# Patient Record
Sex: Male | Born: 1960 | Race: White | Hispanic: No | Marital: Single | Smoking: Former smoker
Health system: Southern US, Community
[De-identification: ages and names within clinical notes are randomized; demographics above are authoritative.]

## PROBLEM LIST (undated history)

## (undated) DIAGNOSIS — R002 Palpitations: Secondary | ICD-10-CM

## (undated) DIAGNOSIS — I10 Essential (primary) hypertension: Secondary | ICD-10-CM

## (undated) DIAGNOSIS — I2510 Atherosclerotic heart disease of native coronary artery without angina pectoris: Secondary | ICD-10-CM

## (undated) DIAGNOSIS — E7800 Pure hypercholesterolemia, unspecified: Secondary | ICD-10-CM

## (undated) DIAGNOSIS — I5189 Other ill-defined heart diseases: Secondary | ICD-10-CM

## (undated) HISTORY — PX: APPENDECTOMY: SHX54

---

## 2005-02-13 ENCOUNTER — Other Ambulatory Visit: Payer: Self-pay

## 2005-02-13 ENCOUNTER — Emergency Department: Payer: Self-pay | Admitting: Emergency Medicine

## 2006-05-21 ENCOUNTER — Ambulatory Visit: Payer: Self-pay | Admitting: Emergency Medicine

## 2012-09-09 DIAGNOSIS — R609 Edema, unspecified: Secondary | ICD-10-CM | POA: Insufficient documentation

## 2012-10-30 ENCOUNTER — Ambulatory Visit: Payer: Self-pay | Admitting: Family Medicine

## 2014-02-01 ENCOUNTER — Ambulatory Visit: Payer: Self-pay | Admitting: Family Medicine

## 2014-02-03 ENCOUNTER — Inpatient Hospital Stay: Payer: Self-pay | Admitting: Internal Medicine

## 2014-02-03 ENCOUNTER — Ambulatory Visit: Payer: Self-pay | Admitting: Family Medicine

## 2014-02-03 LAB — CBC WITH DIFFERENTIAL/PLATELET
Basophil #: 0 10*3/uL (ref 0.0–0.1)
Basophil %: 0.5 %
Eosinophil #: 0 10*3/uL (ref 0.0–0.7)
Eosinophil %: 0.6 %
HCT: 42.2 % (ref 40.0–52.0)
HGB: 13.8 g/dL (ref 13.0–18.0)
Lymphocyte #: 1.3 10*3/uL (ref 1.0–3.6)
Lymphocyte %: 19.6 %
MCH: 26.7 pg (ref 26.0–34.0)
MCHC: 32.7 g/dL (ref 32.0–36.0)
MCV: 82 fL (ref 80–100)
MONOS PCT: 19.5 %
Monocyte #: 1.3 x10 3/mm — ABNORMAL HIGH (ref 0.2–1.0)
Neutrophil #: 3.8 10*3/uL (ref 1.4–6.5)
Neutrophil %: 59.8 %
Platelet: 148 10*3/uL — ABNORMAL LOW (ref 150–440)
RBC: 5.17 10*6/uL (ref 4.40–5.90)
RDW: 15.2 % — ABNORMAL HIGH (ref 11.5–14.5)
WBC: 6.4 10*3/uL (ref 3.8–10.6)

## 2014-02-03 LAB — COMPREHENSIVE METABOLIC PANEL
ALK PHOS: 43 U/L — AB (ref 46–116)
ALT: 39 U/L (ref 14–63)
Albumin: 3.1 g/dL — ABNORMAL LOW (ref 3.4–5.0)
Anion Gap: 4 — ABNORMAL LOW (ref 7–16)
BILIRUBIN TOTAL: 0.4 mg/dL (ref 0.2–1.0)
BUN: 13 mg/dL (ref 7–18)
CREATININE: 1.18 mg/dL (ref 0.60–1.30)
Calcium, Total: 8.7 mg/dL (ref 8.5–10.1)
Chloride: 106 mmol/L (ref 98–107)
Co2: 29 mmol/L (ref 21–32)
EGFR (African American): >60
EGFR (Non-African Amer.): >60
Glucose: 79 mg/dL (ref 65–99)
OSMOLALITY: 277 (ref 275–301)
Potassium: 3.8 mmol/L (ref 3.5–5.1)
SGOT(AST): 32 U/L (ref 15–37)
SODIUM: 139 mmol/L (ref 136–145)
TOTAL PROTEIN: 6.9 g/dL (ref 6.4–8.2)

## 2014-02-04 LAB — CBC WITH DIFFERENTIAL/PLATELET
Basophil #: 0 10*3/uL (ref 0.0–0.1)
Basophil %: 0.3 %
EOS ABS: 0 10*3/uL (ref 0.0–0.7)
Eosinophil %: 0.4 %
HCT: 40.8 % (ref 40.0–52.0)
HGB: 13.4 g/dL (ref 13.0–18.0)
Lymphocyte #: 1.4 10*3/uL (ref 1.0–3.6)
Lymphocyte %: 18.1 %
MCH: 26.6 pg (ref 26.0–34.0)
MCHC: 32.8 g/dL (ref 32.0–36.0)
MCV: 81 fL (ref 80–100)
MONO ABS: 1.5 x10 3/mm — AB (ref 0.2–1.0)
Monocyte %: 19 %
Neutrophil #: 4.8 10*3/uL (ref 1.4–6.5)
Neutrophil %: 62.2 %
PLATELETS: 152 10*3/uL (ref 150–440)
RBC: 5.02 10*6/uL (ref 4.40–5.90)
RDW: 15.3 % — AB (ref 11.5–14.5)
WBC: 7.8 10*3/uL (ref 3.8–10.6)

## 2014-02-04 LAB — BASIC METABOLIC PANEL
Anion Gap: 7 (ref 7–16)
BUN: 12 mg/dL (ref 7–18)
CHLORIDE: 105 mmol/L (ref 98–107)
Calcium, Total: 8.5 mg/dL (ref 8.5–10.1)
Co2: 25 mmol/L (ref 21–32)
Creatinine: 1.15 mg/dL (ref 0.60–1.30)
EGFR (African American): >60
EGFR (Non-African Amer.): >60
Glucose: 106 mg/dL — ABNORMAL HIGH (ref 65–99)
OSMOLALITY: 274 (ref 275–301)
POTASSIUM: 4 mmol/L (ref 3.5–5.1)
Sodium: 137 mmol/L (ref 136–145)

## 2014-02-08 LAB — CULTURE, BLOOD (SINGLE)

## 2014-05-08 NOTE — Consult Note (Signed)
PATIENT NAME:  Zachary Ferguson, Zachary Ferguson MR#:  [id number] DATE OF BIRTH:  06/01/1960  DATE OF CONSULTATION:  02/04/2014  REFERRING PHYSICIAN:   CONSULTING PHYSICIAN:  Stann Mainland. Sampson Goon, MD  REQUESTING PHYSICIAN:  Altamese Dilling, MD.    REASON FOR CONSULTATION:  Facial swelling and redness.   HISTORY OF PRESENT ILLNESS: This  is a 54 year old gentleman with history of hypertension, hypercholesterolemia, is quite active, and works as an Technical brewer. He reports that 4 days ago he started to have some body aches and generalized weakness. Then on Tuesday the 26th he developed vesicles on his forehead.  He was seen in urgent care and diagnosed with shingles, given Valtrex 1 gram q. 8. He was also seen by ophthalmology that same day and the next day as the redness and swelling increased in his right eye. On his followup visit he was given erythromycin cream. However the pain became unbearable on the right side and his eye was so swollen he could not open it. He was sent over for direct admission. He has been admitted to an isolation room. He was started on vancomycin and Zosyn as well as IV acyclovir. Clinically he continues to have severe pain relieved only by Dilaudid. In the past he has not been on any pain medications. He has been started on gabapentin as well. He reports having a fever to 100.8 yesterday and having some chills. He has had a CT of his head which is negative for orbital cellulitis.   PAST MEDICAL HISTORY: Hypertension, hyperlipidemia.   PAST SURGICAL HISTORY: None.   SOCIAL HISTORY: Does not smoke. Drinks socially. He works as an Therapist, occupational.   FAMILY HISTORY: Positive for aneurysm in mother, diabetes in his father, and coronary artery disease.   ALLERGIES: No known drug allergies.   MEDICATIONS SINCE ADMISSION: Acyclovir IV, vancomycin, and Zosyn. He is also on Dilaudid, Tylenol, gabapentin, and just started on prednisone.   REVIEW OF SYSTEMS:  11 systems reviewed  and negative except as per HPI.    PHYSICAL EXAMINATION:  VITAL SIGNS: Temperature 100.8 overnight, now 98.4, pulse 62, blood pressure 113/78, respirations 11 saturation 96% on room air.  GENERAL: He is pleasant, interactive, oriented.  FACE:  On his facial exam he has an impressive erythema and vesicles over his right forehead. He has marked edema of the upper and lower eyelid.  OROPHARYNX: Clear.  NECK: Supple.  EARS:  Tympanic membranes are normal.  HEART: Regular.  LUNGS: Clear.  ABDOMEN: Soft, nontender, nondistended. No hepatosplenomegaly.  EXTREMITIES: No clubbing, cyanosis or edema.  NEUROLOGIC:  He is alert and oriented x 3, grossly nonfocal neurologic exam.   DATA: White blood count 6.4, hemoglobin 13.8, platelets 148,000, now 152,000. Blood cultures 2 of 2 negative. LFTs normal. Renal function normal.   IMAGING: CT of his head, maxillofacial area shows right periorbital and preseptal soft tissue swelling consistent with cellulitis, no evidence of abscess or postseptal inflammation.   IMPRESSION: A 54 year old gentleman with a herpes zoster outbreak on his forehead involving his eyelids. He has marked edema and impressive pain. He may have a bacterial superinfection, however his white count is normal. CT does not show any orbital cellulitis. He has had a fever to 100.8 and some chills.   RECOMMENDATIONS:  1.  Overnight I would continue vancomycin, Zosyn, and acyclovir. However if he continues to improve I would switch him tomorrow to Augmentin and doxycycline for a 10 day course total. He should also finish the Valtrex 1000 mg  [facility].i.d. [facility] for a total of 10 days. I would give a prednisone taper which he has just started today. Suggest a taper over 7-10 days.  2.  I would recommend aggressive pain control with the gabapentin as well as the Dilaudid for now. He will likely need oxycodone for a short course at discharge.  3.  If he worsens clinically I would  suggest ophthalmology evaluation. 4.  Thank you for the consult.  I will be glad to follow with you    ____________________________ Stann Mainlandavid P. Sampson GoonFitzgerald, MD dpf:bu D: 02/04/2014 14:23:02 ET T: 02/04/2014 14:45:43 ET JOB#: [id number]  cc: Stann Mainlandavid P. Sampson GoonFitzgerald, MD, <Dictator> DAVID Sampson GoonFITZGERALD MD ELECTRONICALLY SIGNED 02/16/2014 20:48

## 2014-05-08 NOTE — Discharge Summary (Signed)
PATIENT NAME:  Zachary Ferguson, Zachary Ferguson MR#:  [id number] DATE OF BIRTH:  1960-08-21  DATE OF ADMISSION:  02/03/2014 DATE OF DISCHARGE:  02/05/2014  DISCHARGE DIAGNOSES:  1.  Shingles and possible cellulitis on the face.  2.  Pain.  3.  Hypertension.  4.  Hyperlipidemia.   DISCHARGE MEDICATIONS:  1.  Aspirin 81 mg once a day.  2.  Crestor 40 mg once a day.  3.  Losartan 25 mg once a day.  4.  Loratadine 10 mg once a day.  5.  Metoprolol 50 mg extended release once a day.  6.  Fenofibrate 160 mg once a day.  7.  Acetaminophen and hydrocodone 325/5 mg 4 times a day for 7 days.  8.  Valacyclovir 1 gram oral every 8 hours for 2 days.  9.  Prednisone 10 mg oral tablets, start at 60 mg and taper x 5 mg until complete.  10. MS Contin 15 mg,  tablet every 8 hours for 8 days.  11. Gabapentin 300 mg oral capsule 3 times a day.  12. Amoxicillin and clavulanate 875 mg every 12 hours for 8 days.  13. Doxycycline 100 mg oral 2 times a day for 8 days.   DIET:  Advance to a low sodium and low cholesterol diet on discharge and follow up in 1 to 2 weeks in ophthalmology clinic and primary care physician.   HISTORY OF PRESENTING ILLNESS: A 54 year old male with history of hypertension and hypercholesterolemia for 3 to 4 days.  He had viral-like symptoms, generalized weakness and fatigue and 2 days ago he noticed some vesicles on his right side of his forehead, so went to [facility].  The vesicles were painful.  The doctor told him it was shingles, gave him Valacyclovir prescription and advised to follow in ophthalmology clinic. He went to the ophthalmology clinic, eye sight was clear.  Next day, the swelling was severe, so he went to ophthalmology again, but they checked and the vision was fine, gave him some erythromycin ointment and sent him home.  Swelling and pain continued to get worse, so he went to [facility] again the next day, and because of severe worsening of swelling, he was sent  to the hospital for direct admission.   HOSPITAL COURSE AND STAY:  On admission, because of his severe swelling and pain, he was suspected to have superimposed cellulitis on his shingles and started on IV vancomycin and Zosyn.  Infectious disease consult was called in.  They saw the patient and as the patient's pain and symptoms were getting slowly better in the next 2 days, decided to treat him for a total of 10 days for suspected cellulitis in his shingles, continue overall Valacyclovir plus Augmentin and doxycycline and also started on steroid in the hospital, which we are tapering on discharge.     Other medical issues in the hospital: 1.  Pain because of shingles.  We gave him morphine and oxycodone and given a prescription for the next 1 week.    2.  Hypertension. Continued metoprolol and he remained stable.  3.  Hyperlipidemia. Continued dose of what he was taking and remained stable.   CONSULT IN HOSPITAL:  Dr. Sampson Goon for infectious disease.  LABORATORY DATA IN THE HOSPITAL:  Blood culture showed no growth on admission, WBC count 6.4, hemoglobin 13.8, platelet count 148,000, MCV 82.  BUN 13, creatinine 1.18, sodium 139, potassium 3.8, chloride 106.  CT scan of the maxillofacial area was  done to rule out any spreading of cellulitis, showed right periorbital and 3 septal soft tissue swelling consistent with cellulitis. No evidence of abscess or post-septal inflammation. No evidence of active sinus disease or osteomyelitis.  Creatinine was 1.15 on discharge.   TOTAL TIME SPENT ON DISCHARGE:  40 minutes.     ____________________________ Hope PigeonVaibhavkumar G. Elisabeth PigeonVachhani, MD vgv:DT D: 02/08/2014 07:43:08 ET T: 02/08/2014 09:22:33 ET JOB#: [id number]  cc: Hope PigeonVaibhavkumar G. Elisabeth PigeonVachhani, MD, <Dictator> Altamese DillingVAIBHAVKUMAR Audianna Landgren MD ELECTRONICALLY SIGNED 02/22/2014 10:43

## 2014-05-08 NOTE — H&P (Signed)
PATIENT NAME:  Zachary Ferguson, Zachary Ferguson MR#:  [id number] DATE OF BIRTH:  11-19-1960  DATE OF ADMISSION:  02/03/2014  PRIMARY CARE PHYSICIAN: At [place].    REFERRING PHYSICIAN:  [place] [facility] Dr. Payton Mccallum.   CHIEF COMPLAINT: Swelling on the face.   HISTORY OF PRESENT ILLNESS: This is a 54 year old male who has history of hypertension and hypercholesterolemia. For the last 3-4 days ago he had viral-like symptoms, generalized weakness and fatigue. Next day, which was Tuesday on the 26th he noticed some small vesicles on his right side of forehead, and so he went to [facility] urgent care center as it was painful also and the doctor over there told it is shingles, gave him oral valacyclovir 1 gram every 8 hours and sent him to ophthalmology clinic for checking any infection to eye. Ophthalmologist checked that and told him everything is fine and nothing to do and continue his treatment for his shingles. Next day which was on 27th and Wednesday his pain continued to get worse and there was a little bit more swelling, so he went again to ophthalmology clinic and they checked his vision which was satisfactory, 20/20 and so they gave him some erythromycin local cream to apply on his eye and sent him back home, advised to continue valacyclovir.  Now today, on January 28 his got worse and pain was unbearable, it was so much bad that his eyelid got swollen and he could not open his right eye at all, so he decided to go back to urgent care center and the doctor saw him much worse and so called in for direct admission to hospital as he might need to get admitted as per him. On arrival over here I examined the patient and asked him about any fever. He said that yes, he had some fever and was feeling generalized weakness for the last 3-4 days and aches and pain. He denies any similar history in the past. 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    REVIEW OF  SYSTEMS:   CONSTITUTIONAL: Positive for fatigue and fever. No weight loss or weight gain.  Has pain in the right side of the face and eye.  HEENT:  Eyes, no blurring or double vision, but cannot open the right eye now so he does not know about his vision. No discharge from eye or ear.  No hearing loss, but has some tinnitus and ringing in the ears when he is moving.   RESPIRATORY: No cough, wheezing, hemoptysis, or shortness of breath. CARDIOVASCULAR: No chest pain, orthopnea, edema, arrhythmia, palpitation.  GASTROINTESTINAL: No nausea, vomiting, diarrhea, abdominal pain.  GENITOURINARY: No dysuria, hematuria, or increased frequency.  ENDOCRINE: No heat or cold intolerance. No excessive sweating.  SKIN: No acne, rashes, or lesions.  MUSCULOSKELETAL: No pain or swelling in the joints.  NEUROLOGICAL: No numbness, weakness, tremor, or vertigo.  PSYCHIATRIC: No anxiety, insomnia, bipolar disorder.   PAST MEDICAL HISTORY:  1. Hypertension.  2. Hyperlipidemia.   PAST SURGICAL HISTORY: None.   SOCIAL HISTORY: He does not smoke. Drinks alcohol socially very rarely, like 2-3 times a year. He denies any drugs and he works as an Therapist, occupational.   FAMILY HISTORY: Positive for aneurysm in the brain in mother and diabetes and father had some coronary artery disease.   HOME MEDICATIONS:   1. Valacyclovir 1 gram oral tablet every 8 hours.  2. Metoprolol 50 mg oral once a  day.  3. Crestor 40 mg once a day.  4. Aspirin 81 mg once a day.  5. Acetaminophen and hydrocodone 325 + 5 mg 1-2 tablets 3 times a day for severe pain.   PHYSICAL EXAMINATION:  VITAL SIGNS: Temperature is 98.4, pulse is 82, respiration is 16, blood pressure 126/82, and pulse oximetry is 96 on room air.  GENERAL: The patient is fully alert and oriented to time, place, and person. Does not appear in any acute distress, but discomfort because of pain.  HEENT: Head and neck atraumatic. There is swelling and redness on his  right side of the face on his forehead, extending into his scalp and up to the upper part of the cheek and extending on the side on the same with some visible vesicles which are filled with some turbid-looking fluid and the skin is swollen and red on this area. Eyelid is swollen up to the extent that he is not able to open it voluntarily and there excruciating pain on trying to open his eyelid. On the left side his pupil is reactive to light and vision is normal.  NECK: Supple. No JVD. Thyroid nontender.  RESPIRATORY: Bilateral equal and clear air entry. No wheezing or crepitation.  CARDIOVASCULAR: S1, S2 present, regular. No murmur.  ABDOMEN: Soft, nontender. Bowel sounds present. No organomegaly.  SKIN: No acne, rashes, or lesions.  MUSCULOSKELETAL: No pain or swelling in the joints.  NEUROLOGICAL: No numbness, weakness. Power 5 out of 5. No tremor or rigidity. Follows commands.  PSYCHIATRIC: Does not appear in acute psychiatric illness at this time.   IMPORTANT LABORATORY RESULTS: Since the patient is direct admission there are no laboratories drawn yet, will check once the report comes back.   ASSESSMENT AND PLAN:  A 54 year old male with hypertension and hyperlipidemia, sent from [facility] urgent care center with worsening of his shingles, which he is already getting treatment for the last 2 days, valacyclovir, but the swelling is getting worse and severe eye pain, so sent in for direct admission.   1.  Cellulitis on right face. Appear that there is some superimposed bacterial cellulitis also on the right face and I will start on vancomycin and Zosyn IV for that and pharmacy to dose the medications. We will check his creatinine level now. We will also get blood culture.  2.  Shingles on the right face. The patient has excruciating pain with that, he was already taking oral valacyclovir but he got worse, so for now I will start him on IV acyclovir, pharmacy to dose the medication and will give  morphine for pain control and I spoke to Dr. Sampson Goon for infectious disease consult and we will see the patient.  3.  Swelling around the eye. I will get CT scan of the head to rule out any intraocular extension of the swelling or infection and I spoke to ophthalmologist for consult also.  Meanwhile we will continue giving cold compress to help him with swelling and pain, and will give morphine for pain relief. As per patient he was checked by ophthalmologist yesterday and his vision was fine. 4.  Hypertension. We will continue metoprolol as he was taking at home.  5.  Hyperlipidemia. Continue rosuvastatin as he was taking at home.   CODE STATUS: Full code.   TOTAL TIME SPENT ON THIS ADMISSION: 50 minutes.    ____________________________ Hope Pigeon Elisabeth Pigeon, MD vgv:bu D: 02/03/2014 17:30:37 ET T: 02/03/2014 18:01:06 ET JOB#: [id number]  cc: Hope Pigeon. Elisabeth Pigeon, MD, <Dictator>  Stann Mainlandavid P. Sampson GoonFitzgerald, MD Heath GoldVAIBHAVKUMAR [facility] Center[facility]ACHHANI MD ELECTRONICALLY SIGNED 02/22/2014 10:41

## 2015-03-07 ENCOUNTER — Emergency Department: Payer: 59

## 2015-03-07 ENCOUNTER — Encounter: Payer: Self-pay | Admitting: Emergency Medicine

## 2015-03-07 ENCOUNTER — Inpatient Hospital Stay
Admission: EM | Admit: 2015-03-07 | Discharge: 2015-03-08 | DRG: 948 | Disposition: A | Discharge status: Home / Self Care | Payer: 59 | Attending: Internal Medicine | Admitting: Internal Medicine

## 2015-03-07 DIAGNOSIS — N400 Enlarged prostate without lower urinary tract symptoms: Secondary | ICD-10-CM | POA: Diagnosis present

## 2015-03-07 DIAGNOSIS — I2510 Atherosclerotic heart disease of native coronary artery without angina pectoris: Secondary | ICD-10-CM | POA: Diagnosis present

## 2015-03-07 DIAGNOSIS — Z6829 Body mass index (BMI) 29.0-29.9, adult: Secondary | ICD-10-CM

## 2015-03-07 DIAGNOSIS — R778 Other specified abnormalities of plasma proteins: Secondary | ICD-10-CM

## 2015-03-07 DIAGNOSIS — E7801 Familial hypercholesterolemia: Secondary | ICD-10-CM | POA: Diagnosis present

## 2015-03-07 DIAGNOSIS — Z833 Family history of diabetes mellitus: Secondary | ICD-10-CM

## 2015-03-07 DIAGNOSIS — I25119 Atherosclerotic heart disease of native coronary artery with unspecified angina pectoris: Secondary | ICD-10-CM | POA: Diagnosis present

## 2015-03-07 DIAGNOSIS — R748 Abnormal levels of other serum enzymes: Secondary | ICD-10-CM | POA: Diagnosis not present

## 2015-03-07 DIAGNOSIS — R079 Chest pain, unspecified: Secondary | ICD-10-CM

## 2015-03-07 DIAGNOSIS — R0789 Other chest pain: Secondary | ICD-10-CM

## 2015-03-07 DIAGNOSIS — Z87891 Personal history of nicotine dependence: Secondary | ICD-10-CM

## 2015-03-07 DIAGNOSIS — B0230 Zoster ocular disease, unspecified: Secondary | ICD-10-CM | POA: Diagnosis present

## 2015-03-07 DIAGNOSIS — E663 Overweight: Secondary | ICD-10-CM | POA: Diagnosis present

## 2015-03-07 DIAGNOSIS — R7989 Other specified abnormal findings of blood chemistry: Secondary | ICD-10-CM

## 2015-03-07 DIAGNOSIS — R002 Palpitations: Secondary | ICD-10-CM | POA: Diagnosis present

## 2015-03-07 DIAGNOSIS — Z823 Family history of stroke: Secondary | ICD-10-CM

## 2015-03-07 DIAGNOSIS — I5189 Other ill-defined heart diseases: Secondary | ICD-10-CM | POA: Diagnosis present

## 2015-03-07 DIAGNOSIS — Z8249 Family history of ischemic heart disease and other diseases of the circulatory system: Secondary | ICD-10-CM

## 2015-03-07 DIAGNOSIS — I209 Angina pectoris, unspecified: Secondary | ICD-10-CM

## 2015-03-07 DIAGNOSIS — I10 Essential (primary) hypertension: Secondary | ICD-10-CM | POA: Diagnosis present

## 2015-03-07 DIAGNOSIS — Z7982 Long term (current) use of aspirin: Secondary | ICD-10-CM

## 2015-03-07 DIAGNOSIS — I471 Supraventricular tachycardia: Secondary | ICD-10-CM | POA: Diagnosis present

## 2015-03-07 DIAGNOSIS — Z9049 Acquired absence of other specified parts of digestive tract: Secondary | ICD-10-CM

## 2015-03-07 DIAGNOSIS — E7800 Pure hypercholesterolemia, unspecified: Secondary | ICD-10-CM | POA: Diagnosis present

## 2015-03-07 DIAGNOSIS — Z79899 Other long term (current) drug therapy: Secondary | ICD-10-CM

## 2015-03-07 HISTORY — DX: Essential (primary) hypertension: I10

## 2015-03-07 HISTORY — DX: Palpitations: R00.2

## 2015-03-07 HISTORY — DX: Other ill-defined heart diseases: I51.89

## 2015-03-07 HISTORY — DX: Atherosclerotic heart disease of native coronary artery without angina pectoris: I25.10

## 2015-03-07 HISTORY — DX: Pure hypercholesterolemia, unspecified: E78.00

## 2015-03-07 LAB — TROPONIN I: Troponin I: 0.06 ng/mL — ABNORMAL HIGH (ref <0.031)

## 2015-03-07 LAB — BASIC METABOLIC PANEL
ANION GAP: 8 (ref 5–15)
BUN: 13 mg/dL (ref 6–20)
CHLORIDE: 108 mmol/L (ref 101–111)
CO2: 20 mmol/L — AB (ref 22–32)
CREATININE: 0.99 mg/dL (ref 0.61–1.24)
Calcium: 8.8 mg/dL — ABNORMAL LOW (ref 8.9–10.3)
GFR calc non Af Amer: >60 mL/min (ref >60)
Glucose, Bld: 97 mg/dL (ref 65–99)
Potassium: 3.8 mmol/L (ref 3.5–5.1)
Sodium: 136 mmol/L (ref 135–145)

## 2015-03-07 LAB — CBC
HCT: 51 % (ref 40.0–52.0)
HEMOGLOBIN: 17.3 g/dL (ref 13.0–18.0)
MCH: 27.2 pg (ref 26.0–34.0)
MCHC: 33.9 g/dL (ref 32.0–36.0)
MCV: 80.2 fL (ref 80.0–100.0)
Platelets: 190 10*3/uL (ref 150–440)
RBC: 6.36 MIL/uL — AB (ref 4.40–5.90)
RDW: 15.6 % — ABNORMAL HIGH (ref 11.5–14.5)
WBC: 8.9 10*3/uL (ref 3.8–10.6)

## 2015-03-07 LAB — PROTIME-INR
INR: 1.05
Prothrombin Time: 13.9 seconds (ref 11.4–15.0)

## 2015-03-07 LAB — APTT: aPTT: 33 seconds (ref 24–36)

## 2015-03-07 MED ORDER — IOHEXOL 350 MG/ML SOLN
125.0000 mL | Freq: Once | INTRAVENOUS | Status: AC | PRN
  Administered 2015-03-07: 125 mL via INTRAVENOUS

## 2015-03-07 MED ORDER — NITROGLYCERIN 2 % TD OINT: 1.0000 [in_us] | TOPICAL_OINTMENT | Freq: Once | TRANSDERMAL | Status: DC

## 2015-03-07 MED ORDER — ACETAMINOPHEN 500 MG PO TABS
ORAL_TABLET | ORAL | Status: AC
  Administered 2015-03-07: 1000 mg via ORAL
  Filled 2015-03-07: qty 2

## 2015-03-07 MED ORDER — ACETAMINOPHEN 500 MG PO TABS
1000.0000 mg | ORAL_TABLET | Freq: Once | ORAL | Status: AC
Start: 2015-03-07 — End: 2015-03-07
  Administered 2015-03-07: 1000 mg via ORAL

## 2015-03-07 MED ORDER — HEPARIN BOLUS VIA INFUSION
4000.0000 [IU] | Freq: Once | INTRAVENOUS | Status: AC
  Administered 2015-03-07: 4000 [IU] via INTRAVENOUS
  Filled 2015-03-07: qty 4000

## 2015-03-07 MED ORDER — HEPARIN (PORCINE) IN NACL 100-0.45 UNIT/ML-% IJ SOLN: 12.0000 [IU]/kg/h | Freq: Once | INTRAMUSCULAR | Status: DC

## 2015-03-07 MED ORDER — NITROGLYCERIN 2 % TD OINT
TOPICAL_OINTMENT | TRANSDERMAL | Status: AC
  Filled 2015-03-07: qty 1

## 2015-03-07 MED ORDER — NITROGLYCERIN 0.4 MG SL SUBL
0.4000 mg | SUBLINGUAL_TABLET | SUBLINGUAL | Status: DC | PRN
  Administered 2015-03-07: 0.4 mg via SUBLINGUAL
  Filled 2015-03-07: qty 1

## 2015-03-07 MED ORDER — MORPHINE SULFATE (PF) 4 MG/ML IV SOLN: 4.0000 mg | Freq: Once | INTRAVENOUS | Status: DC

## 2015-03-07 MED ORDER — HEPARIN SODIUM (PORCINE) 5000 UNIT/ML IJ SOLN: 4000.0000 [IU] | Freq: Once | INTRAMUSCULAR | Status: DC

## 2015-03-07 MED ORDER — SODIUM CHLORIDE 0.9 % IV SOLN
Freq: Once | INTRAVENOUS | Status: AC
  Administered 2015-03-07: 22:00:00 via INTRAVENOUS

## 2015-03-07 MED ORDER — HEPARIN (PORCINE) IN NACL 100-0.45 UNIT/ML-% IJ SOLN
1200.0000 [IU]/h | INTRAMUSCULAR | Status: DC
  Administered 2015-03-07: 1000 [IU]/h via INTRAVENOUS
  Filled 2015-03-07 (×2): qty 250

## 2015-03-07 NOTE — ED Notes (Signed)
Holding off on nitropaste until fluids have been administered, current bp is 94/72.

## 2015-03-07 NOTE — ED Provider Notes (Signed)
[facility] Emergency Department Provider Note     Time seen: ----------------------------------------- 8:54 PM on 03/07/2015 -----------------------------------------    I have reviewed the triage vital signs and the nursing notes.   HISTORY  Chief Complaint Chest Pain    HPI Zachary Ferguson is a 55 y.o. male who presents to ER for central chest pain that starts in his upper abdomen and goes through his chest and radiates to the left side of his neck and jaw. Patient states she's had dizziness and nausea with the pain. Patient reports the pain started this morning and became worse throughout the day.Patient has had some vomiting with it, also sweats. Pain has progressively gotten worse throughout the day.   Past Medical History  Diagnosis Date  . SVT (supraventricular tachycardia) (HCC)   . Hypertension   . Hypercholesteremia     There are no active problems to display for this patient.   History reviewed. No pertinent past surgical history.  Allergies Review of patient's allergies indicates no known allergies.  Social History Social History  Substance Use Topics  . Smoking status: Former Games developer  . Smokeless tobacco: None  . Alcohol Use: Yes     Comment: occ    Review of Systems Constitutional: Negative for fever. Eyes: Negative for visual changes. ENT: Negative for sore throat. Cardiovascular: Positive for chest pain Respiratory: Negative for shortness of breath. Gastrointestinal: Negative for abdominal pain, positive for nausea and vomiting Genitourinary: Negative for dysuria. Musculoskeletal: Negative for back pain. Skin: Negative for rash. Neurological: Negative for headaches, focal weakness or numbness.  10-point ROS otherwise negative.  ____________________________________________   PHYSICAL EXAM:  VITAL SIGNS: ED Triage Vitals  Enc Vitals Group     BP 03/07/15 2051 104/79 mmHg     Pulse --      Resp --      Temp  --      Temp src --      SpO2 --      Weight 03/07/15 2048 212 lb (96.163 kg)     Height 03/07/15 2048  (1.803 m)     Head Cir --      Peak Flow --      Pain Score 03/07/15 2048 8     Pain Loc --      Pain Edu? --      Excl. in GC? --     Constitutional: Alert and oriented. Mild distress Eyes: Conjunctivae are normal. PERRL. Normal extraocular movements. ENT   Head: Normocephalic and atraumatic.   Nose: No congestion/rhinnorhea.   Mouth/Throat: Mucous membranes are moist.   Neck: No stridor. Cardiovascular: Normal rate, regular rhythm. Normal and symmetric distal pulses are present in all extremities. No murmurs, rubs, or gallops. Respiratory: Normal respiratory effort without tachypnea nor retractions. Breath sounds are clear and equal bilaterally. No wheezes/rales/rhonchi. Gastrointestinal: Soft and nontender. No distention. No abdominal bruits.  Musculoskeletal: Nontender with normal range of motion in all extremities. No joint effusions.  No lower extremity tenderness nor edema. Neurologic:  Normal speech and language. No gross focal neurologic deficits are appreciated. Speech is normal. No gait instability. Skin:  Skin is warm, dry and intact. No rash noted. Psychiatric: Mood and affect are normal. Speech and behavior are normal. Patient exhibits appropriate insight and judgment. ____________________________________________  EKG: Interpreted by me. Sinus rhythm with a rate of 91 bpm, normal PR interval, normal QRS, normal QT interval. Left axis deviation. No evidence of ST elevation  ____________________________________________  ED COURSE:  Pertinent labs & imaging results that were available during my care of the patient were reviewed by me and considered in my medical decision making (see chart for details). Patient with symptoms concerning for acute coronary syndrome. We'll check basic labs, given nitroglycerin. Patient has already taken aspirin  today. ____________________________________________    LABS (pertinent positives/negatives)  Labs Reviewed  BASIC METABOLIC PANEL - Abnormal; Notable for the following:    CO2 20 (*)    Calcium 8.8 (*)    All other components within normal limits  CBC - Abnormal; Notable for the following:    RBC 6.36 (*)    RDW 15.6 (*)    All other components within normal limits  TROPONIN I - Abnormal; Notable for the following:    Troponin I 0.06 (*)    All other components within normal limits  APTT  PROTIME-INR  HEPARIN LEVEL (UNFRACTIONATED)   CRITICAL CARE Performed by: Emily Filbert   Total critical care time: 30 minutes  Critical care time was exclusive of separately billable procedures and treating other patients.  Critical care was necessary to treat or prevent imminent or life-threatening deterioration.  Critical care was time spent personally by me on the following activities: development of treatment plan with patient and/or surrogate as well as nursing, discussions with consultants, evaluation of patient's response to treatment, examination of patient, obtaining history from patient or surrogate, ordering and performing treatments and interventions, ordering and review of laboratory studies, ordering and review of radiographic studies, pulse oximetry and re-evaluation of patient's condition.  RADIOLOGY  Chest x-ray IMPRESSION: No active disease. IMPRESSION: No acute intrathoracic, abdominal, or pelvic pathology. No CT evidence of aortic dissection.  ____________________________________________  FINAL ASSESSMENT AND PLAN  Chest pain, elevated troponin I level  Plan: Patient with labs and imaging as dictated above. Patient was worrisome symptoms, I have started him on a heparin drip. CT angiogram was performed as dictated above. Patient with a remote history of coronary artery disease. He is currently stable and chest pain is much improved.   Emily Filbert, MD   Emily Filbert, MD 03/07/15 [phone]

## 2015-03-07 NOTE — Progress Notes (Signed)
ANTICOAGULATION CONSULT NOTE - Initial Consult  Pharmacy Consult for heparin Indication: chest pain/ACS  No Known Allergies  Patient Measurements: Height:  (180.3 cm) Weight: 212 lb (96.163 kg) IBW/kg (Calculated) : 75.3 Heparin Dosing Weight: 94.7 kg  Vital Signs: BP: 106/77 mmHg (02/28 2237) Pulse Rate: 75 (02/28 2237)  Labs:  Recent Labs  03/07/15 2101  HGB 17.3  HCT 51.0  PLT 190  CREATININE 0.99  TROPONINI 0.06*    Estimated Creatinine Clearance: 99.8 mL/min (by C-G formula based on Cr of 0.99).   Medical History: Past Medical History  Diagnosis Date  . SVT (supraventricular tachycardia) (HCC)   . Hypertension   . Hypercholesteremia     Medications:  Infusions:  . heparin      Assessment: 54 yom cc central CP that radiates to neck and jaw. Has dizziness and nausea. Pain as worsened through the day. Troponin 0.06 x 1. Pharmacy consulted to dose heparin for ACS.   Goal of Therapy:  Heparin level 0.3-0.7 units/ml Monitor platelets by anticoagulation protocol: Yes   Plan:  Give 4000 units bolus x 1 Start heparin infusion at 1000 units/hr Check anti-Xa level in 6 hours and daily while on heparin Continue to monitor H&H and platelets  Carola Frost, Pharm.D., BCPS Clinical Pharmacist 03/07/2015,10:47 PM

## 2015-03-07 NOTE — ED Notes (Signed)
Patient with complaint of central chest pain that starts in his upper abd through his chest and radiated to his left neck and jaw. Patient states that he has dizziness and nausea with the pain. Patient reports that the pain started this morning and has become worse throughout the day.

## 2015-03-08 ENCOUNTER — Inpatient Hospital Stay (HOSPITAL_COMMUNITY): Payer: 59

## 2015-03-08 ENCOUNTER — Other Ambulatory Visit: Payer: Self-pay | Admitting: Cardiovascular Disease

## 2015-03-08 ENCOUNTER — Encounter: Payer: Self-pay | Admitting: Internal Medicine

## 2015-03-08 DIAGNOSIS — R778 Other specified abnormalities of plasma proteins: Secondary | ICD-10-CM

## 2015-03-08 DIAGNOSIS — I471 Supraventricular tachycardia: Secondary | ICD-10-CM | POA: Diagnosis present

## 2015-03-08 DIAGNOSIS — R748 Abnormal levels of other serum enzymes: Secondary | ICD-10-CM | POA: Diagnosis present

## 2015-03-08 DIAGNOSIS — Z6829 Body mass index (BMI) 29.0-29.9, adult: Secondary | ICD-10-CM | POA: Diagnosis not present

## 2015-03-08 DIAGNOSIS — E663 Overweight: Secondary | ICD-10-CM | POA: Diagnosis present

## 2015-03-08 DIAGNOSIS — I25119 Atherosclerotic heart disease of native coronary artery with unspecified angina pectoris: Secondary | ICD-10-CM | POA: Diagnosis present

## 2015-03-08 DIAGNOSIS — I209 Angina pectoris, unspecified: Secondary | ICD-10-CM

## 2015-03-08 DIAGNOSIS — R002 Palpitations: Secondary | ICD-10-CM | POA: Diagnosis present

## 2015-03-08 DIAGNOSIS — E7801 Familial hypercholesterolemia: Secondary | ICD-10-CM | POA: Diagnosis present

## 2015-03-08 DIAGNOSIS — R7989 Other specified abnormal findings of blood chemistry: Secondary | ICD-10-CM | POA: Diagnosis present

## 2015-03-08 DIAGNOSIS — R072 Precordial pain: Secondary | ICD-10-CM | POA: Diagnosis not present

## 2015-03-08 DIAGNOSIS — I214 Non-ST elevation (NSTEMI) myocardial infarction: Secondary | ICD-10-CM | POA: Insufficient documentation

## 2015-03-08 DIAGNOSIS — I10 Essential (primary) hypertension: Secondary | ICD-10-CM | POA: Diagnosis present

## 2015-03-08 DIAGNOSIS — Z823 Family history of stroke: Secondary | ICD-10-CM | POA: Diagnosis not present

## 2015-03-08 DIAGNOSIS — Z833 Family history of diabetes mellitus: Secondary | ICD-10-CM | POA: Diagnosis not present

## 2015-03-08 DIAGNOSIS — Z87891 Personal history of nicotine dependence: Secondary | ICD-10-CM | POA: Diagnosis not present

## 2015-03-08 DIAGNOSIS — E7800 Pure hypercholesterolemia, unspecified: Secondary | ICD-10-CM

## 2015-03-08 DIAGNOSIS — I25111 Atherosclerotic heart disease of native coronary artery with angina pectoris with documented spasm: Secondary | ICD-10-CM | POA: Diagnosis not present

## 2015-03-08 DIAGNOSIS — Z8249 Family history of ischemic heart disease and other diseases of the circulatory system: Secondary | ICD-10-CM | POA: Diagnosis not present

## 2015-03-08 DIAGNOSIS — Z7982 Long term (current) use of aspirin: Secondary | ICD-10-CM | POA: Diagnosis not present

## 2015-03-08 DIAGNOSIS — N400 Enlarged prostate without lower urinary tract symptoms: Secondary | ICD-10-CM | POA: Diagnosis present

## 2015-03-08 DIAGNOSIS — I2510 Atherosclerotic heart disease of native coronary artery without angina pectoris: Secondary | ICD-10-CM | POA: Diagnosis present

## 2015-03-08 DIAGNOSIS — I200 Unstable angina: Secondary | ICD-10-CM | POA: Insufficient documentation

## 2015-03-08 DIAGNOSIS — B0230 Zoster ocular disease, unspecified: Secondary | ICD-10-CM | POA: Diagnosis present

## 2015-03-08 DIAGNOSIS — R0789 Other chest pain: Secondary | ICD-10-CM

## 2015-03-08 DIAGNOSIS — I5189 Other ill-defined heart diseases: Secondary | ICD-10-CM | POA: Diagnosis present

## 2015-03-08 DIAGNOSIS — Z79899 Other long term (current) drug therapy: Secondary | ICD-10-CM | POA: Diagnosis not present

## 2015-03-08 DIAGNOSIS — Z9049 Acquired absence of other specified parts of digestive tract: Secondary | ICD-10-CM | POA: Diagnosis not present

## 2015-03-08 LAB — LIPID PANEL
Cholesterol: 177 mg/dL (ref 0–200)
HDL: 30 mg/dL — ABNORMAL LOW (ref >40)
LDL Cholesterol: 135 mg/dL — ABNORMAL HIGH (ref 0–99)
Total CHOL/HDL Ratio: 5.9 RATIO
Triglycerides: 59 mg/dL (ref <150)
VLDL: 12 mg/dL (ref 0–40)

## 2015-03-08 LAB — NM MYOCAR MULTI W/SPECT W/WALL MOTION / EF
CHL CUP MPHR: 165 {beats}/min
CHL CUP NUCLEAR SDS: 4
CHL CUP NUCLEAR SSS: 2
CHL CUP STRESS STAGE 2 HR: 56 {beats}/min
CHL CUP STRESS STAGE 2 SPEED: 0 mph
CHL CUP STRESS STAGE 3 GRADE: 0 %
CHL CUP STRESS STAGE 3 HR: 56 {beats}/min
CHL CUP STRESS STAGE 4 DBP: 73 mmHg
CHL CUP STRESS STAGE 4 GRADE: 0 %
CHL CUP STRESS STAGE 4 HR: 76 {beats}/min
CSEPEW: 1 METS
CSEPPMHR: 46 %
Exercise duration (min): 0 min
Exercise duration (sec): 0 s
LV sys vol: 40 mL
LVDIAVOL: 111 mL
Peak BP: 114 mmHg
Peak HR: 76 {beats}/min
Percent HR: 21 %
Rest HR: 60 {beats}/min
SRS: 1
Stage 1 HR: 57 {beats}/min
Stage 2 Grade: 0 %
Stage 3 Speed: 0 mph
Stage 4 SBP: 114 mmHg
Stage 4 Speed: 0 mph
TID: 1.17

## 2015-03-08 LAB — HEMOGLOBIN A1C: Hgb A1c MFr Bld: 5.3 % (ref 4.0–6.0)

## 2015-03-08 LAB — HEPARIN LEVEL (UNFRACTIONATED): Heparin Unfractionated: 0.2 IU/mL — ABNORMAL LOW (ref 0.30–0.70)

## 2015-03-08 LAB — TROPONIN I
Troponin I: 0.03 ng/mL (ref <0.031)
Troponin I: <0.03 ng/mL (ref <0.031)
Troponin I: <0.03 ng/mL (ref <0.031)

## 2015-03-08 LAB — TSH: TSH: 0.2 u[IU]/mL — ABNORMAL LOW (ref 0.350–4.500)

## 2015-03-08 MED ORDER — ROSUVASTATIN CALCIUM 20 MG PO TABS
40.0000 mg | ORAL_TABLET | Freq: Every day | ORAL | Status: DC
  Administered 2015-03-08: 40 mg via ORAL
  Filled 2015-03-08: qty 2

## 2015-03-08 MED ORDER — MORPHINE SULFATE (PF) 2 MG/ML IV SOLN
1.0000 mg | Freq: Once | INTRAVENOUS | Status: AC
  Administered 2015-03-08: 1 mg via INTRAVENOUS

## 2015-03-08 MED ORDER — ACETAMINOPHEN 325 MG PO TABS
650.0000 mg | ORAL_TABLET | Freq: Four times a day (QID) | ORAL | Status: DC | PRN
  Administered 2015-03-08: 650 mg via ORAL
  Filled 2015-03-08: qty 2

## 2015-03-08 MED ORDER — SODIUM CHLORIDE 0.9% FLUSH
3.0000 mL | Freq: Two times a day (BID) | INTRAVENOUS | Status: DC
  Administered 2015-03-08: 3 mL via INTRAVENOUS

## 2015-03-08 MED ORDER — TAMSULOSIN HCL 0.4 MG PO CAPS
0.4000 mg | ORAL_CAPSULE | Freq: Two times a day (BID) | ORAL | Status: DC
  Administered 2015-03-08: 0.4 mg via ORAL
  Filled 2015-03-08: qty 1

## 2015-03-08 MED ORDER — NITROGLYCERIN 0.4 MG SL SUBL: 0.4000 mg | SUBLINGUAL_TABLET | SUBLINGUAL | Status: AC | PRN

## 2015-03-08 MED ORDER — ZOLPIDEM TARTRATE 5 MG PO TABS: 10.0000 mg | ORAL_TABLET | Freq: Every evening | ORAL | Status: DC | PRN

## 2015-03-08 MED ORDER — REGADENOSON 0.4 MG/5ML IV SOLN
0.4000 mg | Freq: Once | INTRAVENOUS | Status: AC
  Administered 2015-03-08: 0.4 mg via INTRAVENOUS

## 2015-03-08 MED ORDER — ATORVASTATIN CALCIUM 40 MG PO TABS: 40.0000 mg | ORAL_TABLET | Freq: Every day | ORAL | Status: DC

## 2015-03-08 MED ORDER — ONDANSETRON HCL 4 MG PO TABS: 4.0000 mg | ORAL_TABLET | Freq: Four times a day (QID) | ORAL | Status: DC | PRN

## 2015-03-08 MED ORDER — METOPROLOL SUCCINATE ER 50 MG PO TB24: 50.0000 mg | ORAL_TABLET | Freq: Every day | ORAL | Status: DC

## 2015-03-08 MED ORDER — ADULT MULTIVITAMIN W/MINERALS CH
1.0000 | ORAL_TABLET | Freq: Every day | ORAL | Status: DC
  Administered 2015-03-08: 1 via ORAL
  Filled 2015-03-08: qty 1

## 2015-03-08 MED ORDER — HEPARIN BOLUS VIA INFUSION
1400.0000 [IU] | Freq: Once | INTRAVENOUS | Status: AC
  Administered 2015-03-08: 1400 [IU] via INTRAVENOUS
  Filled 2015-03-08: qty 1400

## 2015-03-08 MED ORDER — FENOFIBRATE 160 MG PO TABS
160.0000 mg | ORAL_TABLET | Freq: Every day | ORAL | Status: DC
  Administered 2015-03-08: 160 mg via ORAL
  Filled 2015-03-08: qty 1

## 2015-03-08 MED ORDER — TECHNETIUM TC 99M SESTAMIBI - CARDIOLITE
30.2600 | Freq: Once | INTRAVENOUS | Status: AC | PRN
  Administered 2015-03-08: 15:00:00 30.26 via INTRAVENOUS

## 2015-03-08 MED ORDER — ASPIRIN EC 81 MG PO TBEC
81.0000 mg | DELAYED_RELEASE_TABLET | Freq: Every day | ORAL | Status: DC
  Administered 2015-03-08: 81 mg via ORAL
  Filled 2015-03-08: qty 1

## 2015-03-08 MED ORDER — TECHNETIUM TC 99M SESTAMIBI - CARDIOLITE
12.3800 | Freq: Once | INTRAVENOUS | Status: AC | PRN
Start: 2015-03-08 — End: 2015-03-08
  Administered 2015-03-08: 13:00:00 12.38 via INTRAVENOUS

## 2015-03-08 MED ORDER — MORPHINE SULFATE (PF) 2 MG/ML IV SOLN: 1.0000 mg | INTRAVENOUS | Status: DC | PRN

## 2015-03-08 MED ORDER — PREDNISOLONE ACETATE 1 % OP SUSP
1.0000 [drp] | Freq: Two times a day (BID) | OPHTHALMIC | Status: DC
  Administered 2015-03-08 (×2): 1 [drp] via OPHTHALMIC
  Filled 2015-03-08: qty 1

## 2015-03-08 MED ORDER — DOCUSATE SODIUM 100 MG PO CAPS
100.0000 mg | ORAL_CAPSULE | Freq: Two times a day (BID) | ORAL | Status: DC
  Administered 2015-03-08: 100 mg via ORAL
  Filled 2015-03-08: qty 1

## 2015-03-08 MED ORDER — ONDANSETRON HCL 4 MG/2ML IJ SOLN
4.0000 mg | Freq: Four times a day (QID) | INTRAMUSCULAR | Status: DC | PRN
Start: 2015-03-08 — End: 2015-03-08

## 2015-03-08 MED ORDER — PREDNISOLONE ACETATE 1 % OP SUSP
1.0000 [drp] | Freq: Two times a day (BID) | OPHTHALMIC | Status: DC
  Filled 2015-03-08: qty 1

## 2015-03-08 MED ORDER — MORPHINE SULFATE (PF) 2 MG/ML IV SOLN
INTRAVENOUS | Status: AC
  Administered 2015-03-08: 1 mg via INTRAVENOUS
  Filled 2015-03-08: qty 1

## 2015-03-08 MED ORDER — PANTOPRAZOLE SODIUM 40 MG PO TBEC: 40.0000 mg | DELAYED_RELEASE_TABLET | Freq: Every day | ORAL | Status: DC

## 2015-03-08 MED ORDER — ACETAMINOPHEN 650 MG RE SUPP: 650.0000 mg | Freq: Four times a day (QID) | RECTAL | Status: DC | PRN

## 2015-03-08 MED ORDER — INFLUENZA VAC SPLIT QUAD 0.5 ML IM SUSY
0.5000 mL | PREFILLED_SYRINGE | INTRAMUSCULAR | Status: AC
  Administered 2015-03-08: 0.5 mL via INTRAMUSCULAR

## 2015-03-08 MED ORDER — SODIUM CHLORIDE 0.9 % IV SOLN
INTRAVENOUS | Status: DC
  Administered 2015-03-08 (×2): via INTRAVENOUS

## 2015-03-08 MED ORDER — VITAMIN D 1000 UNITS PO TABS
1000.0000 [IU] | ORAL_TABLET | Freq: Every day | ORAL | Status: DC
  Administered 2015-03-08: 1000 [IU] via ORAL
  Filled 2015-03-08: qty 1

## 2015-03-08 MED ORDER — HYDROCORTISONE 2.5 % RE CREA
1.0000 "application " | TOPICAL_CREAM | Freq: Every day | RECTAL | Status: DC | PRN
  Filled 2015-03-08: qty 28.35

## 2015-03-08 MED ORDER — LOSARTAN POTASSIUM 25 MG PO TABS
25.0000 mg | ORAL_TABLET | Freq: Every day | ORAL | Status: DC
  Filled 2015-03-08: qty 1

## 2015-03-08 MED ORDER — ENOXAPARIN SODIUM 40 MG/0.4ML ~~LOC~~ SOLN: 40.0000 mg | SUBCUTANEOUS | Status: DC

## 2015-03-08 NOTE — Discharge Instructions (Signed)
Follow with PMD in 2 weeks. °

## 2015-03-08 NOTE — Progress Notes (Signed)
ANTICOAGULATION CONSULT NOTE - Follow Up Consult  Pharmacy Consult for heparin Indication: chest pain/ACS  No Known Allergies  Patient Measurements: Height:  (180.3 cm) Weight: 212 lb 4.8 oz (96.299 kg) IBW/kg (Calculated) : 75.3 Heparin Dosing Weight: 94.7 kg  Vital Signs: Temp: 98.8 F (37.1 C) (03/01 0531) Temp Source: Oral (03/01 0531) BP: 97/67 mmHg (03/01 0531) Pulse Rate: 64 (03/01 0531)  Labs:  Recent Labs  03/07/15 2101 03/08/15 0301 03/08/15 0620  HGB 17.3  --   --   HCT 51.0  --   --   PLT 190  --   --   APTT 33  --   --   LABPROT 13.9  --   --   INR 1.05  --   --   HEPARINUNFRC  --   --  0.20*  CREATININE 0.99  --   --   TROPONINI 0.06* 0.03 <0.03    Estimated Creatinine Clearance: 99.8 mL/min (by C-G formula based on Cr of 0.99).   Medical History: Past Medical History  Diagnosis Date  . SVT (supraventricular tachycardia) (HCC)   . Hypertension   . Hypercholesteremia     Medications:  Infusions:  . sodium chloride 150 mL/hr at 03/08/15 0227  . heparin 1,000 Units/hr (03/07/15 2318)    Assessment: 54 yom cc central CP that radiates to neck and jaw. Has dizziness and nausea. Pain as worsened through the day. Troponin 0.06 x 1. Pharmacy consulted to dose heparin for ACS.   3/1- HL at 0620: 0.2  Goal of Therapy:  Heparin level 0.3-0.7 units/ml Monitor platelets by anticoagulation protocol: Yes   Plan:  HL subtherapeutic.  Will order heparin bolus 1400 units IV once and then increase drip rate to 1200 units/hr.  Recheck HL in 6 hours at 1430.   Pharmacy will continue to follow.   Noga Fogg G, Pharm.D., BCPS Clinical Pharmacist 03/08/2015,8:18 AM

## 2015-03-08 NOTE — ED Notes (Signed)
Pt c/o chest pain again to L side of chest.  States it feels different than original chest pain.  Dr Sheryle Hail notified of this.  Medication administered.  Pt stated that his chest felt fluttery.  Obtained EKG and given to Dr Sheryle Hail.  MD not concerned at this time.

## 2015-03-08 NOTE — Progress Notes (Signed)
Patient received discharge instructions, pt verbalized understanding. IV was removed with no signs of infection. Dressing clean, dry intact. No skin tears or wounds present. Prescription was printed and given to patient. Patient was escorted out with staff member via wheelchair via private auto. No further needs from care management team. Per Dr. Mariah Milling, stress test negative, okay for patient to discharge home.

## 2015-03-08 NOTE — H&P (Signed)
Zachary Ferguson is an 55 y.o. male.   Chief Complaint: Chest pain HPI: The patient presents emergency department complaining of chest pain. 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 Effectivity usually does not cause any pain or shortness of breath. It began in his upper abdomen and moved substernally into the middle of his chest before radiating to under his left breast. Pain was preceded by lightheadedness as well as some nausea. By the time the patient presented to the emergency department his chest been hurting for approximately 4-5 hours. Pain was significantly reduced with sublingual nitroglycerin but produced a headache. Laboratory evaluation revealed elevated troponin. In the setting of continued chest pain the emergency department staff started heparin and called the hospitalist service for admission.  Past Medical History  Diagnosis Date  . SVT (supraventricular tachycardia) ([place])   . Hypertension   . Hypercholesteremia     Past Surgical History  Procedure Laterality Date  . Appendectomy      Family History  Problem Relation Age of Onset  . CAD Father    Social History:  reports that he has quit smoking. He does not have any smokeless tobacco history on file. He reports that he drinks alcohol. His drug history is not on file.  Allergies: No Known Allergies  Prior to Admission medications   Medication Sig Start Date End Date Taking? Authorizing Provider  aspirin EC 81 MG tablet Take 81 mg by mouth daily.   Yes Historical Provider, MD  cholecalciferol (VITAMIN D) 1000 units tablet Take 1,000 Units by mouth daily.   Yes Historical Provider, MD  CIALIS 20 MG tablet Take 20 mg by mouth as needed.   Yes Historical Provider, MD  fenofibrate 160 MG tablet Take 160 mg by mouth daily.   Yes Historical Provider, MD  losartan (COZAAR) 25 MG tablet Take 25 mg by mouth daily.   Yes Historical Provider, MD  metoprolol succinate (TOPROL-XL) 50 MG 24 hr tablet Take 50 mg by mouth  daily.   Yes Historical Provider, MD  Multiple Vitamin (MULTIVITAMIN WITH MINERALS) TABS tablet Take 1 tablet by mouth daily.   Yes Historical Provider, MD  prednisoLONE acetate (PRED FORTE) 1 % ophthalmic suspension Place 1 drop into the right eye 2 (two) times daily.    Yes Historical Provider, MD  PROCTOZONE-HC 2.5 % rectal cream Apply 1 application topically daily as needed.   Yes Historical Provider, MD  rosuvastatin (CRESTOR) 40 MG tablet Take 40 mg by mouth daily.   Yes Historical Provider, MD  tamsulosin (FLOMAX) 0.4 MG CAPS capsule Take 0.4 mg by mouth 2 (two) times daily.   Yes Historical Provider, MD  TESTIM 50 MG/5GM (1%) GEL Apply 1 application topically daily.   Yes Historical Provider, MD  zolpidem (AMBIEN) 10 MG tablet Take 10 mg by mouth at bedtime as needed for sleep.    Yes Historical Provider, MD     Results for orders placed or performed during the hospital encounter of 03/07/15 (from the past 48 hour(s))  Basic metabolic panel     Status: Abnormal   Collection Time: 03/07/15  9:01 PM  Result Value Ref Range   Sodium 136 135 - 145 mmol/L   Potassium 3.8 3.5 - 5.1 mmol/L   Chloride 108 101 - 111 mmol/L   CO2 20 (L) 22 - 32 mmol/L   Glucose, Bld 97 65 - 99 mg/dL   BUN 13 6 - 20 mg/dL   Creatinine, Ser 0.99 0.61 - 1.24  mg/dL   Calcium 8.8 (L) 8.9 - 10.3 mg/dL   GFR calc non Af Amer >60 >60 mL/min   GFR calc Af Amer >60 >60 mL/min    Comment: (NOTE) The eGFR has been calculated using the CKD EPI equation. This calculation has not been validated in all clinical situations. eGFR's persistently <60 mL/min signify possible Chronic Kidney Disease.    Anion gap 8 5 - 15  CBC     Status: Abnormal   Collection Time: 03/07/15  9:01 PM  Result Value Ref Range   WBC 8.9 3.8 - 10.6 K/uL   RBC 6.36 (H) 4.40 - 5.90 MIL/uL   Hemoglobin 17.3 13.0 - 18.0 g/dL   HCT 51.0 40.0 - 52.0 %   MCV 80.2 80.0 - 100.0 fL   MCH 27.2 26.0 - 34.0 pg   MCHC 33.9 32.0 - 36.0 g/dL   RDW 15.6  (H) 11.5 - 14.5 %   Platelets 190 150 - 440 K/uL  Troponin I     Status: Abnormal   Collection Time: 03/07/15  9:01 PM  Result Value Ref Range   Troponin I 0.06 (H) <0.031 ng/mL    Comment: READ BACK AND VERIFIED WITH Jearld Shines RN AT 2200 03/07/15 MSS.        PERSISTENTLY INCREASED TROPONIN VALUES IN THE RANGE OF 0.04-0.49 ng/mL CAN BE SEEN IN:       -UNSTABLE ANGINA       -CONGESTIVE HEART FAILURE       -MYOCARDITIS       -CHEST TRAUMA       -ARRYHTHMIAS       -LATE PRESENTING MYOCARDIAL INFARCTION       -COPD   CLINICAL FOLLOW-UP RECOMMENDED.   APTT     Status: None   Collection Time: 03/07/15  9:01 PM  Result Value Ref Range   aPTT 33 24 - 36 seconds  Protime-INR     Status: None   Collection Time: 03/07/15  9:01 PM  Result Value Ref Range   Prothrombin Time 13.9 11.4 - 15.0 seconds   INR 1.05    Dg Chest Port 1 View  03/07/2015  CLINICAL DATA:  Patient with complaint of central chest pain that starts in his upper abd through his chest and radiated to his left neck and jaw. Patient states that he has dizziness and nausea with the pain. Patient reports that the pain started this morning and has become worse throughout the day. H/o htn and supraventricular tachycardia and htn EXAM: PORTABLE CHEST 1 VIEW COMPARISON:  02/13/2005 FINDINGS: The heart size and mediastinal contours are within normal limits. Both lungs are clear. No pleural effusion or pneumothorax. The visualized skeletal structures are unremarkable. IMPRESSION: No active disease. Electronically Signed   By: Lajean Manes M.D.   On: 03/07/2015 21:26   Ct Angio Chest Aorta W/cm &/or Wo/cm  03/07/2015  CLINICAL DATA:  55 year old male with chest pain EXAM: CT ANGIOGRAPHY CHEST, ABDOMEN AND PELVIS TECHNIQUE: Multidetector CT imaging through the chest, abdomen and pelvis was performed using the standard protocol during bolus administration of intravenous contrast. Multiplanar reconstructed images and MIPs were obtained  and reviewed to evaluate the vascular anatomy. CONTRAST:  1100m OMNIPAQUE IOHEXOL 350 MG/ML SOLN COMPARISON:  CT of the abdomen dated 05/21/2006 and chest radiograph dated 03/07/2015 FINDINGS: CTA CHEST FINDINGS A 4 mm calcified left upper lobe subpleural nodule. The lungs are otherwise clear. No pleural effusion or pneumothorax. The central airways are patent. The thoracic aorta appears  unremarkable. Evaluation of the pulmonary arteries is limited due to suboptimal opacification of the peripheral branches. No definite central pulmonary artery embolus identified. There is no cardiomegaly or pericardial effusion. There is coronary vascular calcification. There is no hilar or mediastinal adenopathy. The esophagus is grossly unremarkable. No thyroid nodules identified. There is no axillary adenopathy. Chest wall soft tissues appear unremarkable. There is mild degenerative changes of spine. No acute fracture. Review of the MIP images confirms the above findings. CTA ABDOMEN AND PELVIS FINDINGS No intra-abdominal free air or free fluid. The liver, gallbladder, pancreas, spleen, adrenal glands, kidneys, visualized ureters, and urinary bladder appear unremarkable. The prostate and seminal vesicles are grossly unremarkable. There is no evidence of bowel obstruction or inflammation. The appendix is not visualized with certainty. No inflammatory changes identified in the right lower quadrant. Mild aortoiliac atherosclerotic disease. No aneurysm or dissection. The origins of the celiac axis, SMA, IMA as well as the origins of the renal arteries are patent. The hepatic artery appears to arise from the SMA. No portal venous gas identified. There is no adenopathy. Small fat containing left inguinal hernia. The abdominal wall soft tissues appear unremarkable. Degenerative changes of spine. No acute fracture. Review of the MIP images confirms the above findings. IMPRESSION: No acute intrathoracic, abdominal, or pelvic pathology. No  CT evidence of aortic dissection. Electronically Signed   By: Anner Crete M.D.   On: 03/07/2015 23:03   Ct Angio Abd/pel W/ And/or W/o  03/07/2015  CLINICAL DATA:  55 year old male with chest pain EXAM: CT ANGIOGRAPHY CHEST, ABDOMEN AND PELVIS TECHNIQUE: Multidetector CT imaging through the chest, abdomen and pelvis was performed using the standard protocol during bolus administration of intravenous contrast. Multiplanar reconstructed images and MIPs were obtained and reviewed to evaluate the vascular anatomy. CONTRAST:  12m OMNIPAQUE IOHEXOL 350 MG/ML SOLN COMPARISON:  CT of the abdomen dated 05/21/2006 and chest radiograph dated 03/07/2015 FINDINGS: CTA CHEST FINDINGS A 4 mm calcified left upper lobe subpleural nodule. The lungs are otherwise clear. No pleural effusion or pneumothorax. The central airways are patent. The thoracic aorta appears unremarkable. Evaluation of the pulmonary arteries is limited due to suboptimal opacification of the peripheral branches. No definite central pulmonary artery embolus identified. There is no cardiomegaly or pericardial effusion. There is coronary vascular calcification. There is no hilar or mediastinal adenopathy. The esophagus is grossly unremarkable. No thyroid nodules identified. There is no axillary adenopathy. Chest wall soft tissues appear unremarkable. There is mild degenerative changes of spine. No acute fracture. Review of the MIP images confirms the above findings. CTA ABDOMEN AND PELVIS FINDINGS No intra-abdominal free air or free fluid. The liver, gallbladder, pancreas, spleen, adrenal glands, kidneys, visualized ureters, and urinary bladder appear unremarkable. The prostate and seminal vesicles are grossly unremarkable. There is no evidence of bowel obstruction or inflammation. The appendix is not visualized with certainty. No inflammatory changes identified in the right lower quadrant. Mild aortoiliac atherosclerotic disease. No aneurysm or  dissection. The origins of the celiac axis, SMA, IMA as well as the origins of the renal arteries are patent. The hepatic artery appears to arise from the SMA. No portal venous gas identified. There is no adenopathy. Small fat containing left inguinal hernia. The abdominal wall soft tissues appear unremarkable. Degenerative changes of spine. No acute fracture. Review of the MIP images confirms the above findings. IMPRESSION: No acute intrathoracic, abdominal, or pelvic pathology. No CT evidence of aortic dissection. Electronically Signed   By: ALaren EvertsD.  On: 03/07/2015 23:03    Review of Systems  Constitutional: Negative for fever and chills.  HENT: Negative for sore throat and tinnitus.   Eyes: Negative for blurred vision and redness.  Respiratory: Negative for cough and shortness of breath.   Cardiovascular: Positive for chest pain. Negative for palpitations, orthopnea and PND.  Gastrointestinal: Positive for nausea and vomiting. Negative for abdominal pain and diarrhea.  Genitourinary: Negative for dysuria, urgency and frequency.  Musculoskeletal: Negative for myalgias and joint pain.  Skin: Negative for rash.       No lesions  Neurological: Positive for dizziness. Negative for speech change, focal weakness and weakness.  Endo/Heme/Allergies: Does not bruise/bleed easily.       No temperature intolerance  Psychiatric/Behavioral: Negative for depression and suicidal ideas.    Blood pressure 104/70, pulse 70, resp. rate 21, height 5' 11"  (1.803 m), weight 96.163 kg (212 lb), SpO2 94 %. Physical Exam  Nursing note and vitals reviewed. Constitutional: He is oriented to person, place, and time. He appears well-developed and well-nourished. No distress.  HENT:  Head: Normocephalic and atraumatic.  Mouth/Throat: Oropharynx is clear and moist.  Eyes: Conjunctivae and EOM are normal. Pupils are equal, round, and reactive to light. No scleral icterus.  Neck: Normal range of motion.  Neck supple. No JVD present. No tracheal deviation present. No thyromegaly present.  Cardiovascular: Normal rate, regular rhythm and normal heart sounds.  Exam reveals no gallop and no friction rub.   No murmur heard. Respiratory: Effort normal and breath sounds normal. No respiratory distress.  GI: Soft. Bowel sounds are normal. He exhibits no distension. There is no tenderness.  Genitourinary:  Deferred  Musculoskeletal: Normal range of motion. He exhibits no edema.  Lymphadenopathy:    He has no cervical adenopathy.  Neurological: He is alert and oriented to person, place, and time. No cranial nerve deficit.  Skin: Skin is warm and dry. No rash noted. No erythema.  Psychiatric: He has a normal mood and affect. His behavior is normal. Judgment and thought content normal.     Assessment/Plan This is a 55 year old male admitted for NSTEMI. 1. NSTEMI: Continue follow cardiac enzymes. The patient has some relative hypotension (normal systolic pressure in the 225J) but is hemodynamically stable. Heart rate is also controlled. 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 Continue aspirin and heparin drip. Consult cardiology. 2. Familial hypercholesterolemia: This is the patient's primary risk factor although he used to be significantly more overweight. He has lost 70-80 pounds. Continue fenofibrate and Crestor 3. Essential hypertension: Continue metoprolol and losartan  4. BPH: Continue tamsulosin 5. Herpes ophthalmicus: Continue steroid eyedrops 6. Overweight: BMI is 29.2; encourage healthy diet and exercise 7. DVT prophylaxis: Therapeutic heparin 8. GI prophylaxis: None The patient is a full code. Time spent on admission orders and patient care approximately 45 minutes  Harrie Foreman, MD 03/08/2015, 1:54 AM

## 2015-03-08 NOTE — Discharge Summary (Signed)
[facility] - [place] [facility]   PATIENT NAME: Zachary Ferguson    MR#:  [id number]  DATE OF BIRTH:  May 18, 1960  DATE OF ADMISSION:  03/07/2015 ADMITTING PHYSICIAN: Arnaldo Natal, MD  DATE OF DISCHARGE: 03/08/2015  PRIMARY CARE PHYSICIAN: No primary care provider on file.    ADMISSION DIAGNOSIS:  Elevated troponin I level [R79.89] Chest pain [R07.9] Chest pain, unspecified chest pain type [R07.9]  DISCHARGE DIAGNOSIS:  Principal Problem:   Midsternal chest pain Active Problems:   Elevated troponin   Essential hypertension   Hypercholesteremia   Non-obstructive CAD   Palpitations   Diastolic dysfunction   Angina pectoris (HCC)    Stress test - negative.  SECONDARY DIAGNOSIS:   Past Medical History  Diagnosis Date  . Palpitations     a. 01/2010 Event Monitor ([facility]): No significant arrhythmias.  . Essential hypertension   . Hypercholesteremia   . Non-obstructive CAD     a. 01/2010 St Echo ([facility]): mild infsept HK; b. 08/2010 Cath ([facility]): LM nl, LAD 59m, LCX 31m, RCA 60p (FFR nl @ 0.94)-->Med Rx.  . Diastolic dysfunction     a. 12/2010 Echo ([facility]): EF >55%, Gr2 DD, triv MR/TR, mild PR.    HOSPITAL COURSE:   1. NSTEMI: Continue follow cardiac enzymes.    Started on Heparin drip, But troponin remained stable, so NSTEMI ruled out.   Seen By carrdiology- and he did stress test- negative.   So discharge home with PPI. 2. Familial hypercholesterolemia: This is the patient's primary risk factor although he used to be significantly more overweight. He has lost 70-80 pounds. Continue fenofibrate and lipitor- his insurance does not pay for crestor. 3. Essential hypertension: Continue metoprolol and losartan  4. BPH: Continue tamsulosin 5. Herpes ophthalmicus: Continue steroid eyedrops 6. Overweight: BMI is 29.2; encourage healthy diet and exercise 7. DVT prophylaxis: Therapeutic heparin 8. GI prophylaxis: None  DISCHARGE CONDITIONS:    Stable.  CONSULTS OBTAINED:  Treatment Team:  Antonieta Iba, MD  DRUG ALLERGIES:  No Known Allergies  DISCHARGE MEDICATIONS:   Current Discharge Medication List    START taking these medications   Details  atorvastatin (LIPITOR) 40 MG tablet Take 1 tablet (40 mg total) by mouth daily. Qty: 30 tablet, Refills: 0    pantoprazole (PROTONIX) 40 MG tablet Take 1 tablet (40 mg total) by mouth daily. Qty: 30 tablet, Refills: 0      CONTINUE these medications which have NOT CHANGED   Details  aspirin EC 81 MG tablet Take 81 mg by mouth daily.    cholecalciferol (VITAMIN D) 1000 units tablet Take 1,000 Units by mouth daily.    CIALIS 20 MG tablet Take 20 mg by mouth as needed.    fenofibrate 160 MG tablet Take 160 mg by mouth daily.    losartan (COZAAR) 25 MG tablet Take 25 mg by mouth daily.    metoprolol succinate (TOPROL-XL) 50 MG 24 hr tablet Take 50 mg by mouth daily.    Multiple Vitamin (MULTIVITAMIN WITH MINERALS) TABS tablet Take 1 tablet by mouth daily.    prednisoLONE acetate (PRED FORTE) 1 % ophthalmic suspension Place 1 drop into the right eye 2 (two) times daily.     PROCTOZONE-HC 2.5 % rectal cream Apply 1 application topically daily as needed.    tamsulosin (FLOMAX) 0.4 MG CAPS capsule Take 0.4 mg by mouth 2 (two) times daily.    TESTIM 50 MG/5GM (1%) GEL Apply 1 application topically daily.  zolpidem (AMBIEN) 10 MG tablet Take 10 mg by mouth at bedtime as needed for sleep.     nitroGLYCERIN (NITROSTAT) 0.4 MG SL tablet Place 1 tablet (0.4 mg total) under the tongue every 5 (five) minutes as needed for chest pain. Qty: 25 tablet, Refills: 3      STOP taking these medications     rosuvastatin (CRESTOR) 40 MG tablet          DISCHARGE INSTRUCTIONS:    Follow with PMD in 1-2 weeks.  If you experience worsening of your admission symptoms, develop shortness of breath, life threatening emergency, suicidal or homicidal thoughts you must seek  medical attention immediately by calling 911 or calling your MD immediately  if symptoms less severe.  You Must read complete instructions/literature along with all the possible adverse reactions/side effects for all the Medicines you take and that have been prescribed to you. Take any new Medicines after you have completely understood and accept all the possible adverse reactions/side effects.   Please note  You were cared for by a hospitalist during your hospital stay. If you have any questions about your discharge medications or the care you received while you were in the hospital after you are discharged, you can call the unit and asked to speak with the hospitalist on call if the hospitalist that took care of you is not available. Once you are discharged, your primary care physician will handle any further medical issues. Please note that NO REFILLS for any discharge medications will be authorized once you are discharged, as it is imperative that you return to your primary care physician (or establish a relationship with a primary care physician if you do not have one) for your aftercare needs so that they can reassess your need for medications and monitor your lab values.    Today   CHIEF COMPLAINT:   Chief Complaint  Patient presents with  . Chest Pain    HISTORY OF PRESENT ILLNESS:  Zachary Ferguson  is a 55 y.o. male presents emergency department complaining of chest pain. 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 Effectivity usually does not cause any pain or shortness of breath. It began in his upper abdomen and moved substernally into the middle of his chest before radiating to under his left breast. Pain was preceded by lightheadedness as well as some nausea. By the time the patient presented to the emergency department his chest been hurting for approximately 4-5 hours. Pain was significantly reduced with sublingual nitroglycerin but produced a headache. Laboratory  evaluation revealed elevated troponin. In the setting of continued chest pain the emergency department staff started heparin and called the hospitalist service for admission.  VITAL SIGNS:  Blood pressure 103/62, pulse 62, temperature 98.1 F (36.7 C), temperature source Oral, resp. rate 17, height 5\' 11"  (1.803 m), weight 96.299 kg (212 lb 4.8 oz), SpO2 98 %.  I/O:   Intake/Output Summary (Last 24 hours) at 03/08/15 1809 Last data filed at 03/08/15 0644  Gross per 24 hour  Intake 1719.83 ml  Output      0 ml  Net 1719.83 ml    PHYSICAL EXAMINATION:  GENERAL:  55 y.o.-year-old patient lying in the bed with no acute distress.  EYES: Pupils equal, round, reactive to light and accommodation. No scleral icterus. Extraocular muscles intact.  HEENT: Head atraumatic, normocephalic. Oropharynx and nasopharynx clear.  NECK:  Supple, no jugular venous distention. No thyroid enlargement, no tenderness.  LUNGS: Normal breath sounds bilaterally,  no wheezing, rales,rhonchi or crepitation. No use of accessory muscles of respiration.  CARDIOVASCULAR: S1, S2 normal. No murmurs, rubs, or gallops.  ABDOMEN: Soft, non-tender, non-distended. Bowel sounds present. No organomegaly or mass.  EXTREMITIES: No pedal edema, cyanosis, or clubbing.  NEUROLOGIC: Cranial nerves II through XII are intact. Muscle strength 5/5 in all extremities. Sensation intact. Gait not checked.  PSYCHIATRIC: The patient is alert and oriented x 3.  SKIN: No obvious rash, lesion, or ulcer.   DATA REVIEW:   CBC  Recent Labs Lab 03/07/15 2101  WBC 8.9  HGB 17.3  HCT 51.0  PLT 190    Chemistries   Recent Labs Lab 03/07/15 2101  NA 136  K 3.8  CL 108  CO2 20*  GLUCOSE 97  BUN 13  CREATININE 0.99  CALCIUM 8.8*    Cardiac Enzymes  Recent Labs Lab 03/08/15 1645  TROPONINI <0.03    Microbiology Results  Results for orders placed or performed in visit on 02/03/14  Culture, blood (single)     Status: None    Collection Time: 02/03/14  5:46 PM  Result Value Ref Range Status   Micro Text Report   Final       COMMENT                   NO GROWTH AEROBICALLY/ANAEROBICALLY IN 5 DAYS   ANTIBIOTIC                                                      Culture, blood (single)     Status: None   Collection Time: 02/03/14  6:00 PM  Result Value Ref Range Status   Micro Text Report   Final       COMMENT                   NO GROWTH AEROBICALLY/ANAEROBICALLY IN 5 DAYS   ANTIBIOTIC                                                        RADIOLOGY:  Nm Myocar Multi W/spect W/wall Motion / Ef  03/08/2015   There was no ST segment deviation noted during stress.  Pharmacological myocardial perfusion imaging study with no significant  ischemia Normal wall motion, EF estimated at 67% No EKG changes concerning for ischemia at peak stress or in recovery. Low risk scan Signed, Dossie Arbour, MD, Ph.D [facility]   Dg Chest [place] 1 View  03/07/2015  CLINICAL DATA:  Patient with complaint of central chest pain that starts in his upper abd through his chest and radiated to his left neck and jaw. Patient states that he has dizziness and nausea with the pain. Patient reports that the pain started this morning and has become worse throughout the day. H/o htn and supraventricular tachycardia and htn EXAM: PORTABLE CHEST 1 VIEW COMPARISON:  02/13/2005 FINDINGS: The heart size and mediastinal contours are within normal limits. Both lungs are clear. No pleural effusion or pneumothorax. The visualized skeletal structures are unremarkable. IMPRESSION: No active disease. Electronically Signed   By: Amie Portland M.D.   On: 03/07/2015 21:26   Ct Angio Chest  Aorta W/cm &/or Wo/cm  03/07/2015  CLINICAL DATA:  55 year old male with chest pain EXAM: CT ANGIOGRAPHY CHEST, ABDOMEN AND PELVIS TECHNIQUE: Multidetector CT imaging through the chest, abdomen and pelvis was performed using the standard protocol during bolus administration of  intravenous contrast. Multiplanar reconstructed images and MIPs were obtained and reviewed to evaluate the vascular anatomy. CONTRAST:  OMNIPAQUE IOHEXOL 350 MG/ML SOLN COMPARISON:  CT of the abdomen dated 05/21/2006 and chest radiograph dated 03/07/2015 FINDINGS: CTA CHEST FINDINGS A 4 mm calcified left upper lobe subpleural nodule. The lungs are otherwise clear. No pleural effusion or pneumothorax. The central airways are patent. The thoracic aorta appears unremarkable. Evaluation of the pulmonary arteries is limited due to suboptimal opacification of the peripheral branches. No definite central pulmonary artery embolus identified. There is no cardiomegaly or pericardial effusion. There is coronary vascular calcification. There is no hilar or mediastinal adenopathy. The esophagus is grossly unremarkable. No thyroid nodules identified. There is no axillary adenopathy. Chest wall soft tissues appear unremarkable. There is mild degenerative changes of spine. No acute fracture. Review of the MIP images confirms the above findings. CTA ABDOMEN AND PELVIS FINDINGS No intra-abdominal free air or free fluid. The liver, gallbladder, pancreas, spleen, adrenal glands, kidneys, visualized ureters, and urinary bladder appear unremarkable. The prostate and seminal vesicles are grossly unremarkable. There is no evidence of bowel obstruction or inflammation. The appendix is not visualized with certainty. No inflammatory changes identified in the right lower quadrant. Mild aortoiliac atherosclerotic disease. No aneurysm or dissection. The origins of the celiac axis, SMA, IMA as well as the origins of the renal arteries are patent. The hepatic artery appears to arise from the SMA. No portal venous gas identified. There is no adenopathy. Small fat containing left inguinal hernia. The abdominal wall soft tissues appear unremarkable. Degenerative changes of spine. No acute fracture. Review of the MIP images confirms the above  findings. IMPRESSION: No acute intrathoracic, abdominal, or pelvic pathology. No CT evidence of aortic dissection. Electronically Signed   By: Elgie Collard M.D.   On: 03/07/2015 23:03   Ct Angio Abd/pel W/ And/or W/o  03/07/2015  CLINICAL DATA:  55 year old male with chest pain EXAM: CT ANGIOGRAPHY CHEST, ABDOMEN AND PELVIS TECHNIQUE: Multidetector CT imaging through the chest, abdomen and pelvis was performed using the standard protocol during bolus administration of intravenous contrast. Multiplanar reconstructed images and MIPs were obtained and reviewed to evaluate the vascular anatomy. CONTRAST:  OMNIPAQUE IOHEXOL 350 MG/ML SOLN COMPARISON:  CT of the abdomen dated 05/21/2006 and chest radiograph dated 03/07/2015 FINDINGS: CTA CHEST FINDINGS A 4 mm calcified left upper lobe subpleural nodule. The lungs are otherwise clear. No pleural effusion or pneumothorax. The central airways are patent. The thoracic aorta appears unremarkable. Evaluation of the pulmonary arteries is limited due to suboptimal opacification of the peripheral branches. No definite central pulmonary artery embolus identified. There is no cardiomegaly or pericardial effusion. There is coronary vascular calcification. There is no hilar or mediastinal adenopathy. The esophagus is grossly unremarkable. No thyroid nodules identified. There is no axillary adenopathy. Chest wall soft tissues appear unremarkable. There is mild degenerative changes of spine. No acute fracture. Review of the MIP images confirms the above findings. CTA ABDOMEN AND PELVIS FINDINGS No intra-abdominal free air or free fluid. The liver, gallbladder, pancreas, spleen, adrenal glands, kidneys, visualized ureters, and urinary bladder appear unremarkable. The prostate and seminal vesicles are grossly unremarkable. There is no evidence of bowel obstruction or inflammation. The appendix is not  visualized with certainty. No inflammatory changes identified in the right  lower quadrant. Mild aortoiliac atherosclerotic disease. No aneurysm or dissection. The origins of the celiac axis, SMA, IMA as well as the origins of the renal arteries are patent. The hepatic artery appears to arise from the SMA. No portal venous gas identified. There is no adenopathy. Small fat containing left inguinal hernia. The abdominal wall soft tissues appear unremarkable. Degenerative changes of spine. No acute fracture. Review of the MIP images confirms the above findings. IMPRESSION: No acute intrathoracic, abdominal, or pelvic pathology. No CT evidence of aortic dissection. Electronically Signed   By: Elgie Collard M.D.   On: 03/07/2015 23:03     Management plans discussed with the patient, family and they are in agreement.  CODE STATUS:     Code Status Orders        Start     Ordered   03/08/15 0145  Full code   Continuous     03/08/15 0144    Code Status History    Date Active Date Inactive Code Status Order ID Comments User Context   This patient has a current code status but no historical code status.      TOTAL TIME TAKING CARE OF THIS PATIENT: 35 minutes.    Altamese Dilling M.D on 03/08/2015 at 6:09 PM  Between 7am to 6pm - Pager - [phone]  After 6pm go to [email or web address] - password [email or web address] [facility]  [facility]  Office  [phone]  CC: Primary care physician; No primary care provider on file.   Note: This dictation was prepared with Dragon dictation along with smaller phrase technology. Any transcriptional errors that result from this process are unintentional.

## 2015-03-08 NOTE — Consult Note (Signed)
Cardiology Consultation Note  Patient ID: Zachary Ferguson, MRN: [id number], DOB/AGE: Sep 15, 1960 55 y.o. Admit date: 03/07/2015   Date of Consult: 03/08/2015 Primary Physician: No primary care provider on file. Primary Cardiologist:  Outside cardiologist  Chief Complaint: Vomiting, epigastric pain, chest pain Reason for Consult: Chest pain symptoms Physician ordering the consult is Dr. Sheryle Hail  HPI: 55 y.o. male with h/o coronary artery disease, prior cardiac catheterization in 2012 showing 60% proximal RCA disease, hyperlipidemia, prior smoking history who stopped 20 years ago (total smoking history of 12 years), who presents with symptoms of chest pain.  He reports that he worked a 24 hour shift, got home yesterday morning, developed GI upset, nausea. He vomited twice between 8:30 and 9:30 in the morning, had profound lightheadedness, diaphoresis. Later in his bathroom with his feet up for a while until lightheadedness resolved. He continued to have some upper epigastric discomfort, then symptoms seemed to spread up into his mediastinal area which persisted all day. Later in the day he took baby aspirin 4, again repeated this regiment several hours later.  When symptoms did not resolve, wife took him into the emergency room, symptoms relieved somewhat with nitroglycerin, overnight with no significant symptoms. He was started on heparin infusion in the emergency room.  Cardiac enzymes 0.06, otherwise a 0.04. Currently pain free, feels well, denies any GI upset, no further nausea or vomiting symptoms.  Reports he has been working out at [employer] on a regular basis with no symptoms of angina. Compliant with his Crestor, no recent smoking for many years, he is a nondiabetic.  CT scan of the chest, chest x-ray, EKG reviewed independently by myself   Past Medical History  Diagnosis Date  . Palpitations     a. 01/2010 Event Monitor ([facility]): No significant arrhythmias.  . Essential hypertension     . Hypercholesteremia   . Non-obstructive CAD     a. 01/2010 St Echo ([facility]): mild infsept HK; b. 08/2010 Cath ([facility]): LM nl, LAD 44m, LCX 35m, RCA 60p (FFR nl @ 0.94)-->Med Rx.  . Diastolic dysfunction     a. 12/2010 Echo ([facility]): EF >55%, Gr2 DD, triv MR/TR, mild PR.      Most Recent Cardiac Studies: Stress test pending   CT scan of the chest reviewed by myself independently showing mild three-vessel coronary calcific plaque, very mild PAD in the aorta    Surgical History:  Past Surgical History  Procedure Laterality Date  . Appendectomy       Home Meds: Prior to Admission medications   Medication Sig Start Date End Date Taking? Authorizing Provider  aspirin EC 81 MG tablet Take 81 mg by mouth daily.   Yes Historical Provider, MD  cholecalciferol (VITAMIN D) 1000 units tablet Take 1,000 Units by mouth daily.   Yes Historical Provider, MD  CIALIS 20 MG tablet Take 20 mg by mouth as needed.   Yes Historical Provider, MD  fenofibrate 160 MG tablet Take 160 mg by mouth daily.   Yes Historical Provider, MD  losartan (COZAAR) 25 MG tablet Take 25 mg by mouth daily.   Yes Historical Provider, MD  metoprolol succinate (TOPROL-XL) 50 MG 24 hr tablet Take 50 mg by mouth daily.   Yes Historical Provider, MD  Multiple Vitamin (MULTIVITAMIN WITH MINERALS) TABS tablet Take 1 tablet by mouth daily.   Yes Historical Provider, MD  prednisoLONE acetate (PRED FORTE) 1 % ophthalmic suspension Place 1 drop into the right eye 2 (two) times daily.  Yes Historical Provider, MD  PROCTOZONE-HC 2.5 % rectal cream Apply 1 application topically daily as needed.   Yes Historical Provider, MD  rosuvastatin (CRESTOR) 40 MG tablet Take 40 mg by mouth daily.   Yes Historical Provider, MD  tamsulosin (FLOMAX) 0.4 MG CAPS capsule Take 0.4 mg by mouth 2 (two) times daily.   Yes Historical Provider, MD  TESTIM 50 MG/5GM (1%) GEL Apply 1 application topically daily.   Yes Historical Provider, MD  zolpidem (AMBIEN) 10 MG  tablet Take 10 mg by mouth at bedtime as needed for sleep.    Yes Historical Provider, MD    Inpatient Medications:  . aspirin EC  81 mg Oral Daily  . cholecalciferol  1,000 Units Oral Daily  . docusate sodium  100 mg Oral BID  . fenofibrate  160 mg Oral Daily  . [START ON 03/09/2015] Influenza vac split quadrivalent PF  0.5 mL Intramuscular Tomorrow-1000  . losartan  25 mg Oral Daily  . metoprolol succinate  50 mg Oral Daily  .  morphine injection  4 mg Intravenous Once  . multivitamin with minerals  1 tablet Oral Daily  . nitroGLYCERIN  1 inch Topical Once  . prednisoLONE acetate  1 drop Right Eye BID  . rosuvastatin  40 mg Oral Daily  . sodium chloride flush  3 mL Intravenous Q12H  . tamsulosin  0.4 mg Oral BID   . sodium chloride 150 mL/hr at 03/08/15 0918  . heparin 1,200 Units/hr (03/08/15 0834)    Allergies: No Known Allergies  Social History   Social History  . Marital Status: Single    Spouse Name: N/A  . Number of Children: N/A  . Years of Education: N/A   Occupational History  . Not on file.   Social History Main Topics  . Smoking status: Former Smoker -- 1.00 packs/day for 12 years    Types: Cigarettes    Quit date: 10/07/1997  . Smokeless tobacco: Not on file  . Alcohol Use: 0.0 oz/week    0 Standard drinks or equivalent per week     Comment: occas  . Drug Use: Not on file  . Sexual Activity: Not on file   Other Topics Concern  . Not on file   Social History Narrative     Family History  Problem Relation Age of Onset  . CAD Father   . Stroke Mother   . Hypertension Mother   . Diabetes Mother      Review of Systems: ROS All other systems were reviewed and negative.   Labs:  Recent Labs  03/07/15 2101 03/08/15 0301 03/08/15 0620  TROPONINI 0.06* 0.03 <0.03   Lab Results  Component Value Date   WBC 8.9 03/07/2015   HGB 17.3 03/07/2015   HCT 51.0 03/07/2015   MCV 80.2 03/07/2015   PLT 190 03/07/2015    Recent Labs Lab  03/07/15 2101  NA 136  K 3.8  CL 108  CO2 20*  BUN 13  CREATININE 0.99  CALCIUM 8.8*  GLUCOSE 97   Lab Results  Component Value Date   CHOL 177 03/08/2015   HDL 30* 03/08/2015   LDLCALC 135* 03/08/2015   TRIG 59 03/08/2015   No results found for: DDIMER  Radiology/Studies:  Dg Chest Port 1 View  03/07/2015  CLINICAL DATA:  Patient with complaint of central chest pain that starts in his upper abd through his chest and radiated to his left neck and jaw. Patient states that he has dizziness  and nausea with the pain. Patient reports that the pain started this morning and has become worse throughout the day. H/o htn and supraventricular tachycardia and htn EXAM: PORTABLE CHEST 1 VIEW COMPARISON:  02/13/2005 FINDINGS: The heart size and mediastinal contours are within normal limits. Both lungs are clear. No pleural effusion or pneumothorax. The visualized skeletal structures are unremarkable. IMPRESSION: No active disease. Electronically Signed   By: Amie Portland M.D.   On: 03/07/2015 21:26   Ct Angio Chest Aorta W/cm &/or Wo/cm  03/07/2015  CLINICAL DATA:  55 year old male with chest pain EXAM: CT ANGIOGRAPHY CHEST, ABDOMEN AND PELVIS TECHNIQUE: Multidetector CT imaging through the chest, abdomen and pelvis was performed using the standard protocol during bolus administration of intravenous contrast. Multiplanar reconstructed images and MIPs were obtained and reviewed to evaluate the vascular anatomy. CONTRAST:  OMNIPAQUE IOHEXOL 350 MG/ML SOLN COMPARISON:  CT of the abdomen dated 05/21/2006 and chest radiograph dated 03/07/2015 FINDINGS: CTA CHEST FINDINGS A 4 mm calcified left upper lobe subpleural nodule. The lungs are otherwise clear. No pleural effusion or pneumothorax. The central airways are patent. The thoracic aorta appears unremarkable. Evaluation of the pulmonary arteries is limited due to suboptimal opacification of the peripheral branches. No definite central pulmonary artery  embolus identified. There is no cardiomegaly or pericardial effusion. There is coronary vascular calcification. There is no hilar or mediastinal adenopathy. The esophagus is grossly unremarkable. No thyroid nodules identified. There is no axillary adenopathy. Chest wall soft tissues appear unremarkable. There is mild degenerative changes of spine. No acute fracture. Review of the MIP images confirms the above findings. CTA ABDOMEN AND PELVIS FINDINGS No intra-abdominal free air or free fluid. The liver, gallbladder, pancreas, spleen, adrenal glands, kidneys, visualized ureters, and urinary bladder appear unremarkable. The prostate and seminal vesicles are grossly unremarkable. There is no evidence of bowel obstruction or inflammation. The appendix is not visualized with certainty. No inflammatory changes identified in the right lower quadrant. Mild aortoiliac atherosclerotic disease. No aneurysm or dissection. The origins of the celiac axis, SMA, IMA as well as the origins of the renal arteries are patent. The hepatic artery appears to arise from the SMA. No portal venous gas identified. There is no adenopathy. Small fat containing left inguinal hernia. The abdominal wall soft tissues appear unremarkable. Degenerative changes of spine. No acute fracture. Review of the MIP images confirms the above findings. IMPRESSION: No acute intrathoracic, abdominal, or pelvic pathology. No CT evidence of aortic dissection. Electronically Signed   By: Elgie Collard M.D.   On: 03/07/2015 23:03   Ct Angio Abd/pel W/ And/or W/o  03/07/2015  CLINICAL DATA:  55 year old male with chest pain EXAM: CT ANGIOGRAPHY CHEST, ABDOMEN AND PELVIS TECHNIQUE: Multidetector CT imaging through the chest, abdomen and pelvis was performed using the standard protocol during bolus administration of intravenous contrast. Multiplanar reconstructed images and MIPs were obtained and reviewed to evaluate the vascular anatomy. CONTRAST:   OMNIPAQUE IOHEXOL 350 MG/ML SOLN COMPARISON:  CT of the abdomen dated 05/21/2006 and chest radiograph dated 03/07/2015 FINDINGS: CTA CHEST FINDINGS A 4 mm calcified left upper lobe subpleural nodule. The lungs are otherwise clear. No pleural effusion or pneumothorax. The central airways are patent. The thoracic aorta appears unremarkable. Evaluation of the pulmonary arteries is limited due to suboptimal opacification of the peripheral branches. No definite central pulmonary artery embolus identified. There is no cardiomegaly or pericardial effusion. There is coronary vascular calcification. There is no hilar or mediastinal adenopathy. The esophagus is  grossly unremarkable. No thyroid nodules identified. There is no axillary adenopathy. Chest wall soft tissues appear unremarkable. There is mild degenerative changes of spine. No acute fracture. Review of the MIP images confirms the above findings. CTA ABDOMEN AND PELVIS FINDINGS No intra-abdominal free air or free fluid. The liver, gallbladder, pancreas, spleen, adrenal glands, kidneys, visualized ureters, and urinary bladder appear unremarkable. The prostate and seminal vesicles are grossly unremarkable. There is no evidence of bowel obstruction or inflammation. The appendix is not visualized with certainty. No inflammatory changes identified in the right lower quadrant. Mild aortoiliac atherosclerotic disease. No aneurysm or dissection. The origins of the celiac axis, SMA, IMA as well as the origins of the renal arteries are patent. The hepatic artery appears to arise from the SMA. No portal venous gas identified. There is no adenopathy. Small fat containing left inguinal hernia. The abdominal wall soft tissues appear unremarkable. Degenerative changes of spine. No acute fracture. Review of the MIP images confirms the above findings. IMPRESSION: No acute intrathoracic, abdominal, or pelvic pathology. No CT evidence of aortic dissection. Electronically Signed   By:  Elgie Collard M.D.   On: 03/07/2015 23:03    EKG: EKG with normal sinus rhythm, nonspecific ST abnormality anterolateral and inferior leads  Weights: [facility] Weights   03/07/15 2048 03/08/15 0223  Weight: 212 lb (96.163 kg) 212 lb 4.8 oz (96.299 kg)     Physical Exam: Blood pressure 103/62, pulse 62, temperature 98.1 F (36.7 C), temperature source Oral, resp. rate 17, height 5\' 11"  (1.803 m), weight 212 lb 4.8 oz (96.299 kg), SpO2 98 %. Body mass index is 29.62 kg/(m^2). General: Well developed, well nourished, in no acute distress. Head: Normocephalic, atraumatic, sclera non-icteric, no xanthomas, nares are without discharge.  Neck: Negative for carotid bruits. JVD not elevated. Lungs: Clear bilaterally to auscultation without wheezes, rales, or rhonchi. Breathing is unlabored. Heart: RRR with S1 S2. No murmurs, rubs, or gallops appreciated. Abdomen: Soft, non-tender, non-distended with normoactive bowel sounds. No hepatomegaly. No rebound/guarding. No obvious abdominal masses. Msk:  Strength and tone appear normal for age. Extremities: No clubbing or cyanosis. No edema.  Distal pedal pulses are 2+ and equal bilaterally. Neuro: Alert and oriented X 3. No facial asymmetry. No focal deficit. Moves all extremities spontaneously. Psych:  Responds to questions appropriately with a normal affect.    Assessment and Plan:   1. Chest pain Etiology unclear though some atypical features Possible gastritis with vomiting 2 yesterday morning then followed by some upper epigastric and chest discomfort. -Sounds as if he had a vasovagal event in the setting of vomiting as he had lightheadedness, had to lay on the floor until symptoms resolved -CT scan reviewed showing only mild coronary disease, very minimal PAD Very few risk factors over the past 10 years, Very remote smoking history, on aggressive cholesterol medication, no diabetes  Long discussion with the patient concerning the various  treatment options including medical management, stress testing, cardiac catheterization. He prefers stress testing though prefers a pharmacologic Myoview as he has several relatives who have died performing a treadmill stress test  We have called the nuclear lab, additional dose has been ordered, he will be scheduled for the test this afternoon. Some technical issues on one of the cameras, he will use the older camera but should still work fine. Patient is agreeable to this, we'll keep nothing by mouth for now  Okay to hold heparin as enzymes are negative, he is pain-free  2. CAD: Own history of  coronary artery disease by cardiac catheterization in 2012, notably in the proximal RCA as detailed above. Stress test as above  3. Hyperlipidemia Would continue his Crestor Goal LDL less than 70  4. Abdominal discomfort, vomiting Etiology unclear, possible gastroenteritis, symptoms have resolved after yesterday, no further symptoms Likely does not need any further GI workup  5. Hypertension Blood pressure is low, may benefit from some IV fluids would continue his current medications at discharge  Signed, Dossie Arbour, MD. Ph.D [facility] 03/08/2015, 12:19 PM

## 2015-09-07 ENCOUNTER — Telehealth: Payer: Self-pay | Admitting: Gastroenterology

## 2015-09-07 NOTE — Telephone Encounter (Signed)
Colonoscopy at [facility] °

## 2015-09-08 NOTE — Telephone Encounter (Signed)
Called patient and left a voicemail. VERIFY THAT HIS INSURANCE IS STILL THE SAME AS WE HAVE IT IN REGISTRATION!

## 2015-09-12 NOTE — Telephone Encounter (Signed)
Called patient and LVM.

## 2015-09-14 NOTE — Telephone Encounter (Signed)
Called patient and LVM. Notification letter sent.  

## 2016-09-29 ENCOUNTER — Ambulatory Visit
Admission: EM | Admit: 2016-09-29 | Discharge: 2016-09-29 | Disposition: A | Discharge status: Home / Self Care | Payer: BLUE CROSS/BLUE SHIELD | Attending: Emergency Medicine | Admitting: Emergency Medicine

## 2016-09-29 DIAGNOSIS — R109 Unspecified abdominal pain: Secondary | ICD-10-CM | POA: Diagnosis not present

## 2016-09-29 DIAGNOSIS — K2100 Gastro-esophageal reflux disease with esophagitis, without bleeding: Secondary | ICD-10-CM

## 2016-09-29 DIAGNOSIS — R0789 Other chest pain: Secondary | ICD-10-CM

## 2016-09-29 DIAGNOSIS — R110 Nausea: Secondary | ICD-10-CM | POA: Diagnosis not present

## 2016-09-29 LAB — CBC WITH DIFFERENTIAL/PLATELET
Basophils Absolute: 0.1 10*3/uL (ref 0–0.1)
Basophils Relative: 1 %
EOS PCT: 1 %
Eosinophils Absolute: 0.1 10*3/uL (ref 0–0.7)
HEMATOCRIT: 46.4 % (ref 40.0–52.0)
Hemoglobin: 16.1 g/dL (ref 13.0–18.0)
LYMPHS ABS: 2.6 10*3/uL (ref 1.0–3.6)
LYMPHS PCT: 29 %
MCH: 30 pg (ref 26.0–34.0)
MCHC: 34.7 g/dL (ref 32.0–36.0)
MCV: 86.5 fL (ref 80.0–100.0)
MONO ABS: 1.2 10*3/uL — AB (ref 0.2–1.0)
MONOS PCT: 14 %
Neutro Abs: 4.9 10*3/uL (ref 1.4–6.5)
Neutrophils Relative %: 55 %
PLATELETS: 197 10*3/uL (ref 150–440)
RBC: 5.37 MIL/uL (ref 4.40–5.90)
RDW: 13.7 % (ref 11.5–14.5)
WBC: 8.8 10*3/uL (ref 3.8–10.6)

## 2016-09-29 LAB — COMPREHENSIVE METABOLIC PANEL
ALBUMIN: 3.9 g/dL (ref 3.5–5.0)
ALT: 17 U/L (ref 17–63)
AST: 19 U/L (ref 15–41)
Alkaline Phosphatase: 56 U/L (ref 38–126)
Anion gap: 6 (ref 5–15)
BUN: 14 mg/dL (ref 6–20)
CHLORIDE: 107 mmol/L (ref 101–111)
CO2: 25 mmol/L (ref 22–32)
CREATININE: 0.8 mg/dL (ref 0.61–1.24)
Calcium: 9.1 mg/dL (ref 8.9–10.3)
GFR calc Af Amer: >60 mL/min (ref >60)
GLUCOSE: 112 mg/dL — AB (ref 65–99)
Potassium: 3.7 mmol/L (ref 3.5–5.1)
Sodium: 138 mmol/L (ref 135–145)
Total Bilirubin: 0.8 mg/dL (ref 0.3–1.2)
Total Protein: 7.6 g/dL (ref 6.5–8.1)

## 2016-09-29 LAB — LIPASE, BLOOD: LIPASE: 27 U/L (ref 11–51)

## 2016-09-29 MED ORDER — GI COCKTAIL ~~LOC~~
30.0000 mL | Freq: Once | ORAL | Status: AC
Start: 2016-09-29 — End: 2016-09-29
  Administered 2016-09-29: 30 mL via ORAL

## 2016-09-29 MED ORDER — PANTOPRAZOLE SODIUM 40 MG PO TBEC: 40.0000 mg | DELAYED_RELEASE_TABLET | Freq: Two times a day (BID) | ORAL | 0 refills | Status: AC

## 2016-09-29 MED ORDER — PANTOPRAZOLE SODIUM 40 MG PO TBEC: 40.0000 mg | DELAYED_RELEASE_TABLET | Freq: Two times a day (BID) | ORAL | 0 refills | Status: DC

## 2016-09-29 NOTE — Discharge Instructions (Signed)
Continue Zantac 150 mg twice a day, stop the Nexium and start the Protonix 40 mg twice a day for a week and then decrease it to 40 mg once a day. elevate the head of your bed 30 at night to help prevent it from refluxing into your esophagus, and continue avoiding spicy, greasy, fatty foods. follow-up with your gastroenterologist if this does not work, to the ER for the signs and symptoms we discussed.

## 2016-09-29 NOTE — ED Triage Notes (Addendum)
Pt with 4 days of "heartburn" and metal taste in mouth and much worse after eating and drinking. Nausea but no vomiting. Pain in abdomen 3/10 but jumps to an 8 with taking p.o. Has tried Zantac and Nexium which only help temporarily

## 2016-09-29 NOTE — ED Provider Notes (Signed)
HPI  SUBJECTIVE:  Zachary Ferguson is a 56 y.o. male who presents with burning midline chest pain that was intermittent and has become constant over the past 2 days. States that it radiates from the bottom to the top. He reports belching, water brash, intermittent epigastric crampy nonradiating nonmigratory abdominal pain. He reports several episodes of nausea accompanied by diaphoresis but the chest pain does not change during these episodes. He denies vomiting, fevers. He also reports a 15 minute episode of left upper arm pain yesterday described as heaviness while showering, but he did not have any change in his chest pain at that point in time. He denies palpitations, nausea or diaphoresis during this time. He states that this is not the same pain as he had when he had his elevated troponins. He has tried Nexium and 150 mg of Zantac for the past 2 days with partial response. Has also tried Tylenol. Symptoms are better with fasting, Nexium and Zantac. Symptoms are worse with lying down, eating spicy, greasy, fatty foods and with coffee. States that he can eat bland foods without any problem. Per hospital records, he has been prescribed PPIs in the past, but has not been taking them recently. He has a past medical history of elevated troponin, NSTEMI was ruled out during that admission, hypertension, palpitations, hypercholesterolemia, GERD. No history of atrial fibrillation, hypercoagulability, mesenteric ischemia, H. pylori disease, peptic ulcer disease, excess NSAID use, smoking, alcohol use. States he has an occasional beer once a month. Family history significant for MI in father age 64, uncle age 49, 3 cousins in their 26s, grandfather age 56. ZOX:WRUEAVW, Sherrine Maples, MD    Past Medical History:  Diagnosis Date  . Diastolic dysfunction    a. 12/2010 Echo ([facility]): EF >55%, Gr2 DD, triv MR/TR, mild PR.  . Essential hypertension   . Hypercholesteremia   . Non-obstructive CAD    a. 01/2010 St Echo ([facility]):  mild infsept HK; b. 08/2010 Cath ([facility]): LM nl, LAD 47m, LCX 69m, RCA 60p (FFR nl @ 0.94)-->Med Rx.  . Palpitations    a. 01/2010 Event Monitor ([facility]): No significant arrhythmias.    Past Surgical History:  Procedure Laterality Date  . APPENDECTOMY      Family History  Problem Relation Age of Onset  . CAD Father   . Stroke Mother   . Hypertension Mother   . Diabetes Mother     Social History  Substance Use Topics  . Smoking status: Former Smoker    Packs/day: 1.00    Years: 12.00    Types: Cigarettes    Quit date: 10/07/1997  . Smokeless tobacco: Never Used  . Alcohol use 0.0 oz/week     Comment: occas    No current facility-administered medications for this encounter.   Current Outpatient Prescriptions:  .  rosuvastatin (CRESTOR) 40 MG tablet, Take 40 mg by mouth daily., Disp: , Rfl:  .  aspirin EC 81 MG tablet, Take 81 mg by mouth daily., Disp: , Rfl:  .  losartan (COZAAR) 25 MG tablet, Take 25 mg by mouth daily., Disp: , Rfl:  .  metoprolol succinate (TOPROL-XL) 50 MG 24 hr tablet, Take 50 mg by mouth daily., Disp: , Rfl:  .  nitroGLYCERIN (NITROSTAT) 0.4 MG SL tablet, Place 1 tablet (0.4 mg total) under the tongue every 5 (five) minutes as needed for chest pain., Disp: 25 tablet, Rfl: 3  No Known Allergies   ROS  As noted in HPI.   Physical Exam  BP  131/82 (BP Location: Left Arm)   Pulse 71   Temp 98.4 F (36.9 C) (Oral)   Resp 16   Ht  (1.803 m)   Wt 227 lb (103 kg)   SpO2 98%   BMI 31.66 kg/m   Constitutional: Well developed, well nourished, no acute distress Eyes:  EOMI, conjunctiva normal bilaterally HENT: Normocephalic, atraumatic,mucus membranes moist Respiratory: Normal inspiratory effort, Lungs clear bilaterally.  Cardiovascular: Normal rate regular rhythm no murmurs rubs or gallops GI: nondistended soft, mild epigastric tenderness, normal bowel sounds, no guarding, rebound. Skin: No rash, skin intact Musculoskeletal: no  deformities Neurologic: Alert & oriented x 3, no focal neuro deficits Psychiatric: Speech and behavior appropriate   ED Course   Medications  gi cocktail (Maalox,Lidocaine,Donnatal) (30 mLs Oral Given 09/29/16 1256)    Orders Placed This Encounter  Procedures  . Comprehensive metabolic panel    Standing Status:   Standing    Number of Occurrences:   1  . Lipase, blood    Standing Status:   Standing    Number of Occurrences:   1  . CBC with Differential    Standing Status:   Standing    Number of Occurrences:   1  . ED EKG    "heartburn"    Standing Status:   Standing    Number of Occurrences:   1    Order Specific Question:   Reason for Exam    Answer:   Other (See Comments)  . ED EKG    Standing Status:   Standing    Number of Occurrences:   1    Order Specific Question:   Reason for Exam    Answer:   Chest Pain    Results for orders placed or performed during the hospital encounter of 09/29/16 (from the past 24 hour(s))  Comprehensive metabolic panel     Status: Abnormal   Collection Time: 09/29/16 12:52 PM  Result Value Ref Range   Sodium 138 135 - 145 mmol/L   Potassium 3.7 3.5 - 5.1 mmol/L   Chloride 107 101 - 111 mmol/L   CO2 25 22 - 32 mmol/L   Glucose, Bld 112 (H) 65 - 99 mg/dL   BUN 14 6 - 20 mg/dL   Creatinine, Ser 1.61 0.61 - 1.24 mg/dL   Calcium 9.1 8.9 - 09.6 mg/dL   Total Protein 7.6 6.5 - 8.1 g/dL   Albumin 3.9 3.5 - 5.0 g/dL   AST 19 15 - 41 U/L   ALT 17 17 - 63 U/L   Alkaline Phosphatase 56 38 - 126 U/L   Total Bilirubin 0.8 0.3 - 1.2 mg/dL   GFR calc non Af Amer >60 >60 mL/min   GFR calc Af Amer >60 >60 mL/min   Anion gap 6 5 - 15  Lipase, blood     Status: None   Collection Time: 09/29/16 12:52 PM  Result Value Ref Range   Lipase 27 11 - 51 U/L  CBC with Differential     Status: Abnormal   Collection Time: 09/29/16 12:52 PM  Result Value Ref Range   WBC 8.8 3.8 - 10.6 K/uL   RBC 5.37 4.40 - 5.90 MIL/uL   Hemoglobin 16.1 13.0 - 18.0  g/dL   HCT 04.5 40.9 - 81.1 %   MCV 86.5 80.0 - 100.0 fL   MCH 30.0 26.0 - 34.0 pg   MCHC 34.7 32.0 - 36.0 g/dL   RDW 91.4 78.2 - 95.6 %  Platelets 197 150 - 440 K/uL   Neutrophils Relative % 55 %   Neutro Abs 4.9 1.4 - 6.5 K/uL   Lymphocytes Relative 29 %   Lymphs Abs 2.6 1.0 - 3.6 K/uL   Monocytes Relative 14 %   Monocytes Absolute 1.2 (H) 0.2 - 1.0 K/uL   Eosinophils Relative 1 %   Eosinophils Absolute 0.1 0 - 0.7 K/uL   Basophils Relative 1 %   Basophils Absolute 0.1 0 - 0.1 K/uL   No results found.  ED Clinical Impression  GERD with esophagitis  ED Assessment/Plan  Previous ER and hospital records were reviewed. Patient had chest pain and elevated troponins, initially thought to have an NSTEMI in February 2017 but this was ruled out with stable troponins and a negative stress test.   CT scan of the chest showed mild three-vessel coronary calcific plaques. Stress test on 03/08/2015 was normal.  EKG: Normal sinus rhythm, rate 73. Normal axis, normal intervals. No hypertrophy. No ST-T wave changes compared to EKG from 03/09/2015  EKG was obtained while the patient was symptomatic, and he had a negative recent stress test, so feel that this is more GI rather than cardiac. It is partially responding to Zantac and Nexium. He does have some subxiphoid, Epigastric tenderness which would be consistent with peptic ulcer disease/gastritis versus GERD. Doubt mesenteric ischemia as he is able to eat some foods without making the pain worse and he has no risk factors. We'll give GI cocktail, check CBC, CMP and lipase and reevaluate.  CBC, CMP, lipase normal.  Reevaluation, patient states that his symptoms have completely resolved after the GI cocktail. We'll send home with continued Zantac 150 mg twice a day, discontinue his Nexium and start him on Protonix 40 mg twice a day, for a week and then decrease it to 40 mg once a day. He is to elevate the head of his bed 30 at night and to continue  avoiding spicy, greasy, fatty foods. He will follow-up with his gastroenterologist if this does not work.  Forgot to prescribe Protonix for patient was in the department. E prescribed Protonix 40 mg by mouth twice a day dispensed #30 to [facility].   Discussed labs, MDM, plan and followup with patient and family. Discussed sn/sx that should prompt return to the ED. Patient agrees with plan.   Meds ordered this encounter  Medications  . rosuvastatin (CRESTOR) 40 MG tablet    Sig: Take 40 mg by mouth daily.  Marland Kitchen gi cocktail (Maalox,Lidocaine,Donnatal)    *This clinic note was created using Dragon dictation software. Therefore, there may be occasional mistakes despite careful proofreading.  ?   Domenick Gong, MD 09/29/16 1724

## 2016-10-03 NOTE — ED Notes (Signed)
Pt called asking for referral to see Gastroenterologist. .  Per pt the MD who saw him at  [facility] Sunday told him to call if he needed a referral. Nothing found on chart about pt will be given a referral to see Gastroenterologist by Urgent Care MD. Instructed patient to call his PCP to get a referral and also he was let know that no note was found on pt's chart about receiving a referral from Urgent;s Care MD.   Patient agreed to call his PCP for referral.

## 2016-12-02 NOTE — Progress Notes (Deleted)
Cardiology Office Note  Date:  12/02/2016   ID:  Zachary Ferguson, DOB 07-Sep-1960, MRN [id number]  PCP:  Manfred ArchWithrow, Glenn, MD   No chief complaint on file.   HPI:  Zachary Ferguson is a 56 y.o. male   ER 09/29/2016 burning midline chest pain that was intermittent and has become constant over the past 2 days. States that it radiates from the bottom to the top.   He reports belching,intermittent epigastric crampy nonradiating nonmigratory abdominal pain.   He reports several episodes of nausea accompanied by diaphoresis but the chest pain does not change during these episodes. He denies vomiting, fevers. He also reports a 15 minute episode of left upper arm pain yesterday described as heaviness while showering, but he did not have any change in his chest pain at that point in time. He denies palpitations, nausea or diaphoresis during this time. He states that this is not the same pain as he had when he had his elevated troponins. He has tried Nexium and 150 mg of Zantac for the past 2 days with partial response. Has also tried Tylenol. Symptoms are better with fasting, Nexium and Zantac. Symptoms are worse with lying down, eating spicy, greasy, fatty foods and with coffee. States that he can eat bland foods without any problem. Per hospital records, he has been prescribed PPIs in the past, but has not been taking them recently. He has a past medical history of elevated troponin, NSTEMI was ruled out during that admission, hypertension, palpitations, hypercholesterolemia, GERD. No history of atrial fibrillation, hypercoagulability, mesenteric ischemia, H. pylori disease, peptic ulcer disease, excess NSAID use, smoking, alcohol use. States he has an occasional beer once a month. Family history significant for MI in father age 57, uncle age 57, 3 cousins in their 1240s, grandfather age 57. WGN:[id number]:Withrow, Sherrine MaplesGlenn, MD   PMH:   has a past medical history of Diastolic dysfunction, Essential hypertension,  Hypercholesteremia, Non-obstructive CAD, and Palpitations.  PSH:    Past Surgical History:  Procedure Laterality Date  . APPENDECTOMY      Current Outpatient Medications  Medication Sig Dispense Refill  . aspirin EC 81 MG tablet Take 81 mg by mouth daily.    Marland Kitchen. losartan (COZAAR) 25 MG tablet Take 25 mg by mouth daily.    . metoprolol succinate (TOPROL-XL) 50 MG 24 hr tablet Take 50 mg by mouth daily.    . nitroGLYCERIN (NITROSTAT) 0.4 MG SL tablet Place 1 tablet (0.4 mg total) under the tongue every 5 (five) minutes as needed for chest pain. 25 tablet 3  . pantoprazole (PROTONIX) 40 MG tablet Take 1 tablet (40 mg total) by mouth 2 (two) times daily. 30 tablet 0  . rosuvastatin (CRESTOR) 40 MG tablet Take 40 mg by mouth daily.     No current facility-administered medications for this visit.      Allergies:   Patient has no known allergies.   Social History:  The patient  reports that he quit smoking about 19 years ago. His smoking use included cigarettes. He has a 12.00 pack-year smoking history. he has never used smokeless tobacco. He reports that he drinks alcohol. He reports that he does not use drugs.   Family History:   family history includes CAD in his father; Diabetes in his mother; Hypertension in his mother; Stroke in his mother.    Review of Systems: ROS   PHYSICAL EXAM: VS:  There were no vitals taken for this visit. , BMI There is no height or  weight on file to calculate BMI. GEN: Well nourished, well developed, in no acute distress HEENT: normal Neck: no JVD, carotid bruits, or masses Cardiac: RRR; no murmurs, rubs, or gallops,no edema  Respiratory:  clear to auscultation bilaterally, normal work of breathing GI: soft, nontender, nondistended, + BS MS: no deformity or atrophy Skin: warm and dry, no rash Neuro:  Strength and sensation are intact Psych: euthymic mood, full affect    Recent Labs: 09/29/2016: ALT 17; BUN 14; Creatinine, Ser 0.80; Hemoglobin  16.1; Platelets 197; Potassium 3.7; Sodium 138    Lipid Panel Lab Results  Component Value Date   CHOL 177 03/08/2015   HDL 30 (L) 03/08/2015   LDLCALC 135 (H) 03/08/2015   TRIG 59 03/08/2015      Wt Readings from Last 3 Encounters:  09/29/16 227 lb (103 kg)  03/08/15 212 lb 4.8 oz (96.3 kg)       ASSESSMENT AND PLAN:  No diagnosis found.   Disposition:   F/U  6 months  No orders of the defined types were placed in this encounter.    Signed, Dossie Arbourim Gollan, M.D., Ph.D. 12/02/2016  [facility] [place] [phone]

## 2016-12-03 ENCOUNTER — Ambulatory Visit: Payer: BLUE CROSS/BLUE SHIELD | Admitting: Cardiovascular Disease

## 2016-12-06 ENCOUNTER — Encounter: Payer: Self-pay | Admitting: Cardiovascular Disease

## 2017-06-08 IMAGING — CT CT CTA ABD/PEL W/CM AND/OR W/O CM
1 of 2 series · 14 of 32 positions shown, 18 images · IV contrast (APPLIED)
Comparison: CT of the abdomen dated 05/21/2006 and chest radiograph
dated 03/07/2015

CLINICAL DATA: 54-year-old male with chest pain

EXAM:
CT ANGIOGRAPHY CHEST, ABDOMEN AND PELVIS
TECHNIQUE: Multidetector CT imaging through the chest, abdomen and pelvis was
performed using the standard protocol during bolus administration of
intravenous contrast. Multiplanar reconstructed images and MIPs were
obtained and reviewed to evaluate the vascular anatomy.
CONTRAST:  125mL OMNIPAQUE IOHEXOL 350 MG/ML SOLN

[Series 6: cta cap · axial · 0.77mm/px · z∈[-936,-374]mm · 14 of 319 slices shown, 18 images]
[im 25/319  soft-tissue]
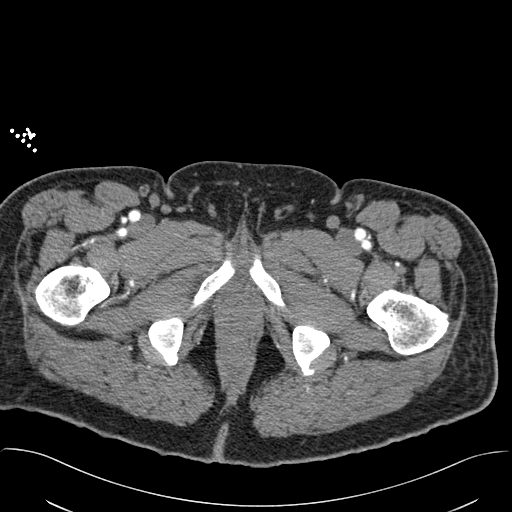
[im 25/319  bone]
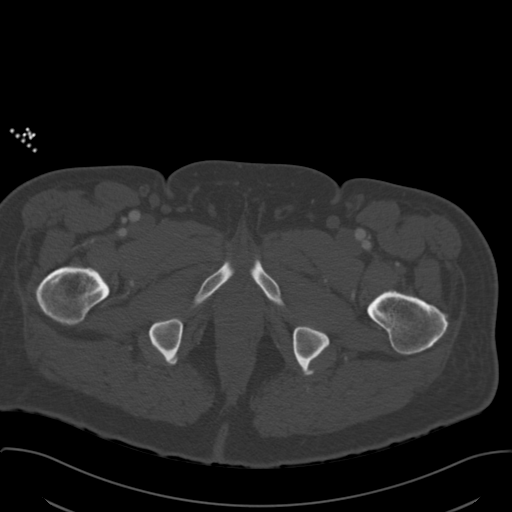
[im 49/319  soft-tissue]
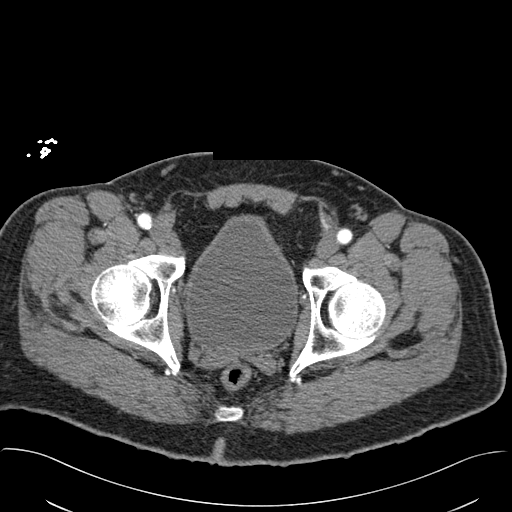
[im 74/319  soft-tissue]
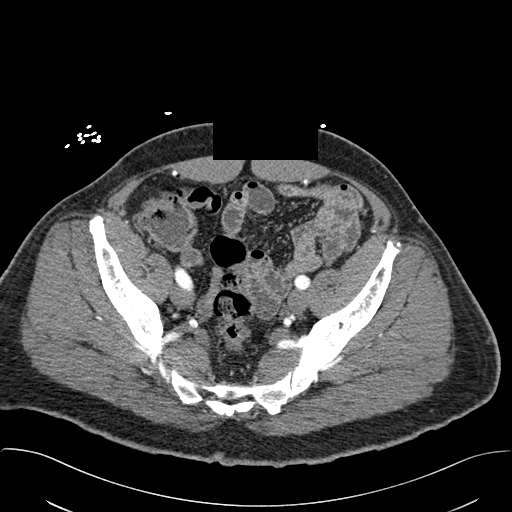
[im 98/319  soft-tissue]
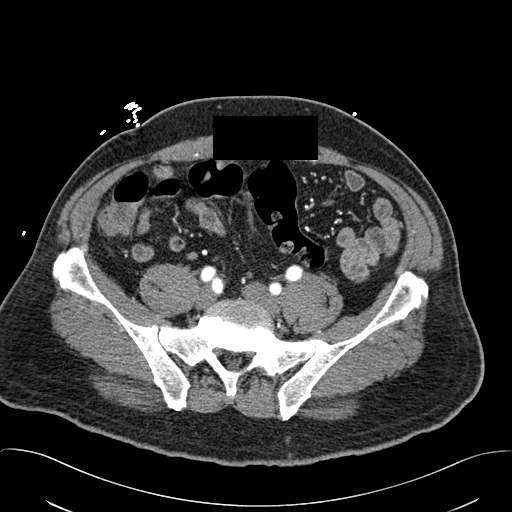
[im 123/319  soft-tissue]
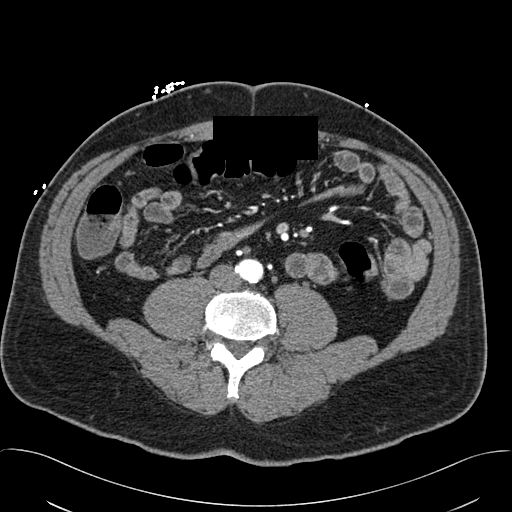
[im 147/319  soft-tissue]
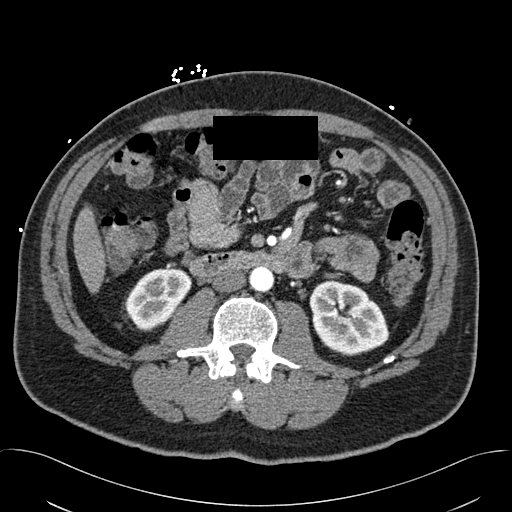
[im 172/319  soft-tissue]
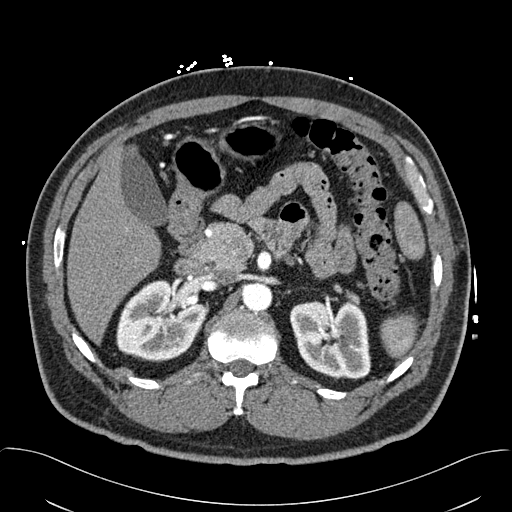
[im 196/319  soft-tissue]
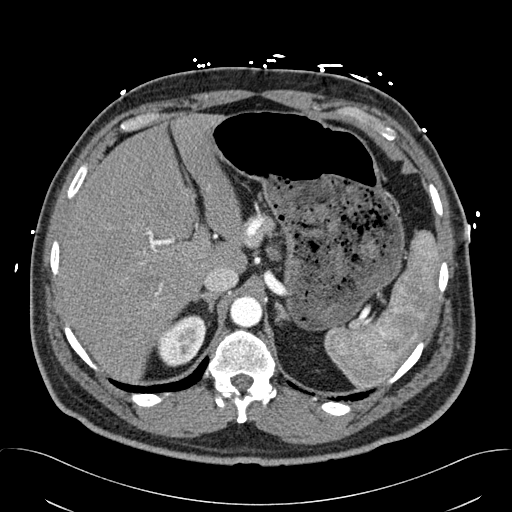
[im 221/319  soft-tissue]
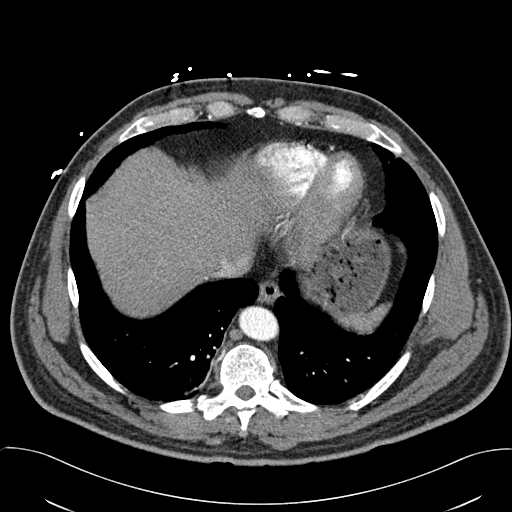
[im 221/319  bone]
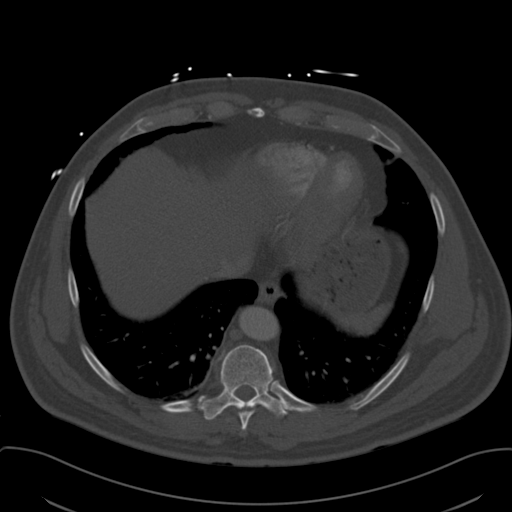
[im 245/319  soft-tissue]
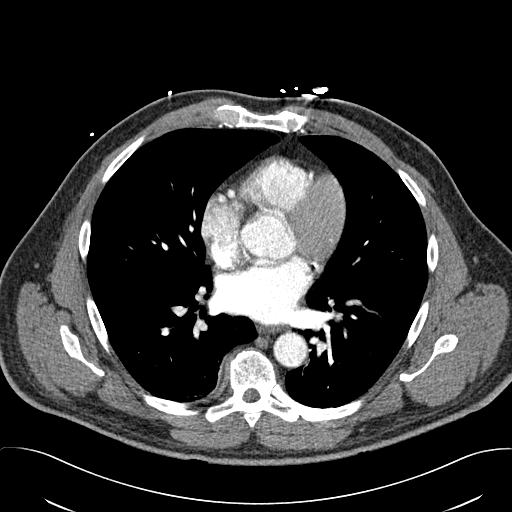
[im 270/319  soft-tissue]
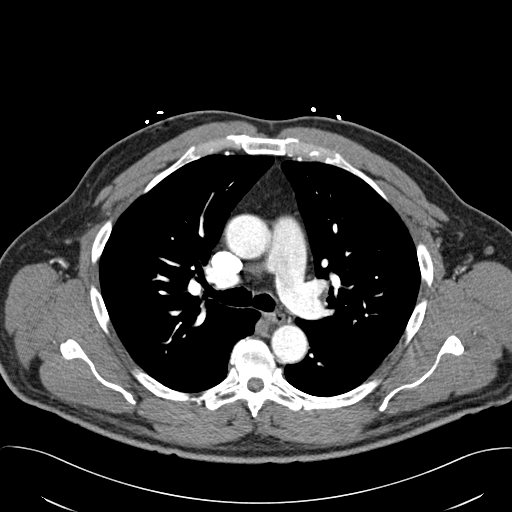
[im 270/319  lung]
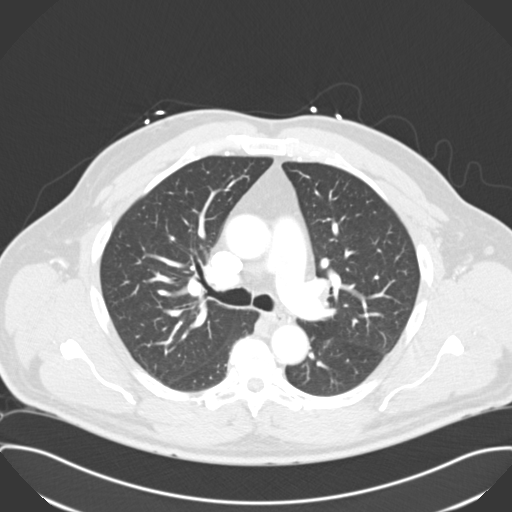
[im 282/319  lung]
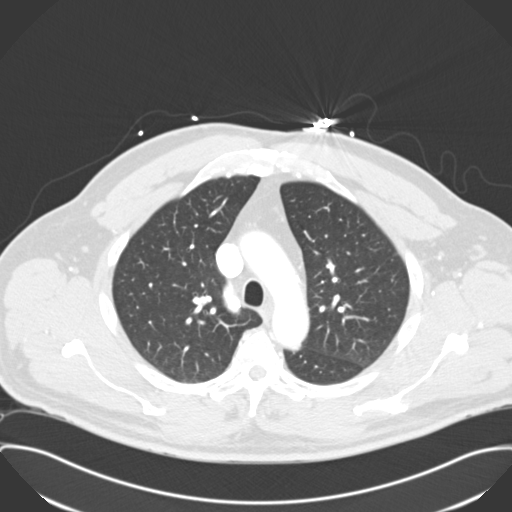
[im 294/319  soft-tissue]
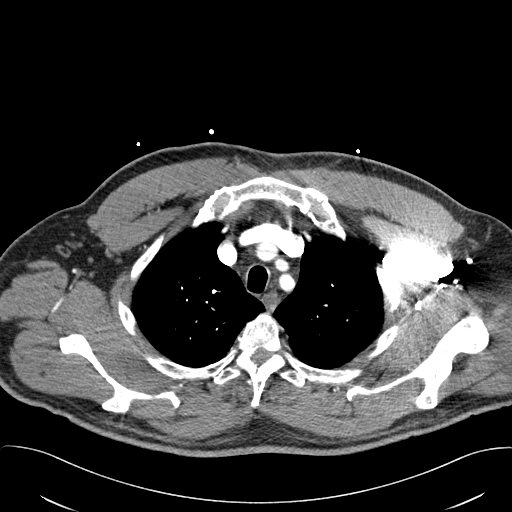
[im 294/319  lung]
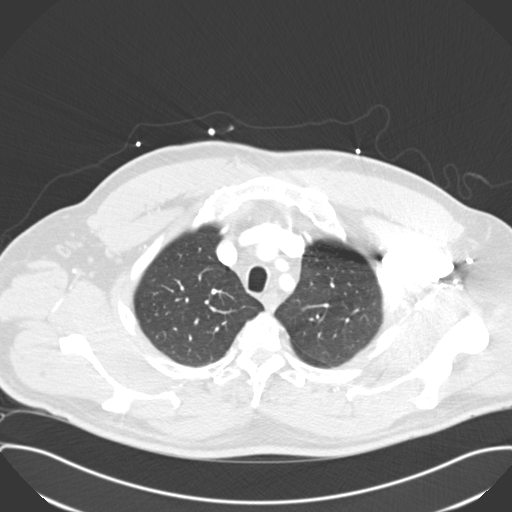
[im 306/319  lung]
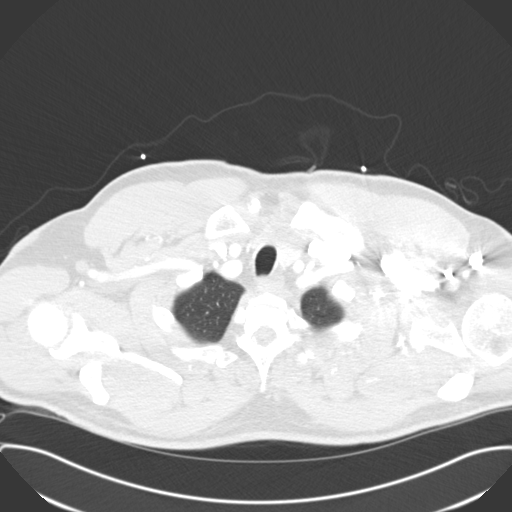

[14 of 32 positions shown; findings below may reference images not displayed]

FINDINGS: CTA CHEST FINDINGS

A 4 mm calcified left upper lobe subpleural nodule. The lungs are
otherwise clear. No pleural effusion or pneumothorax. The central
airways are patent.

The thoracic aorta appears unremarkable. Evaluation of the pulmonary
arteries is limited due to suboptimal opacification of the
peripheral branches. No definite central pulmonary artery embolus
identified. There is no cardiomegaly or pericardial effusion. There
is coronary vascular calcification. There is no hilar or mediastinal
adenopathy. The esophagus is grossly unremarkable. No thyroid
nodules identified.

There is no axillary adenopathy. Chest wall soft tissues appear
unremarkable. There is mild degenerative changes of spine. No acute
fracture.

Review of the MIP images confirms the above findings.

CTA ABDOMEN AND PELVIS FINDINGS

No intra-abdominal free air or free fluid.

The liver, gallbladder, pancreas, spleen, adrenal glands, kidneys,
visualized ureters, and urinary bladder appear unremarkable. The
prostate and seminal vesicles are grossly unremarkable.

There is no evidence of bowel obstruction or inflammation. The
appendix is not visualized with certainty. No inflammatory changes
identified in the right lower quadrant.

Mild aortoiliac atherosclerotic disease. No aneurysm or dissection.
The origins of the celiac axis, SMA, IMA as well as the origins of
the renal arteries are patent. The hepatic artery appears to arise
from the SMA. No portal venous gas identified. There is no
adenopathy. Small fat containing left inguinal hernia. The abdominal
wall soft tissues appear unremarkable. Degenerative changes of
spine. No acute fracture.

Review of the MIP images confirms the above findings.
IMPRESSION: No acute intrathoracic, abdominal, or pelvic pathology. No CT
evidence of aortic dissection.
